# Patient Record
Sex: Female | Born: 1945 | Race: White | Hispanic: No | Marital: Married | Smoking: Never smoker
Health system: Southern US, Community
[De-identification: ages and names within clinical notes are randomized; demographics above are authoritative.]

## PROBLEM LIST (undated history)

## (undated) DIAGNOSIS — E079 Disorder of thyroid, unspecified: Secondary | ICD-10-CM

## (undated) DIAGNOSIS — F32A Depression, unspecified: Secondary | ICD-10-CM

## (undated) DIAGNOSIS — I10 Essential (primary) hypertension: Secondary | ICD-10-CM

## (undated) DIAGNOSIS — F329 Major depressive disorder, single episode, unspecified: Secondary | ICD-10-CM

---

## 2015-07-16 ENCOUNTER — Emergency Department (HOSPITAL_COMMUNITY)
Admission: EM | Admit: 2015-07-16 | Discharge: 2015-07-16 | Disposition: A | Discharge status: Home / Self Care | Payer: Medicare Other | Attending: Emergency Medicine | Admitting: Emergency Medicine

## 2015-07-16 ENCOUNTER — Encounter (HOSPITAL_COMMUNITY): Payer: Self-pay | Admitting: Emergency Medicine

## 2015-07-16 DIAGNOSIS — Z8639 Personal history of other endocrine, nutritional and metabolic disease: Secondary | ICD-10-CM | POA: Insufficient documentation

## 2015-07-16 DIAGNOSIS — F329 Major depressive disorder, single episode, unspecified: Secondary | ICD-10-CM | POA: Diagnosis not present

## 2015-07-16 DIAGNOSIS — B029 Zoster without complications: Secondary | ICD-10-CM

## 2015-07-16 DIAGNOSIS — Z79899 Other long term (current) drug therapy: Secondary | ICD-10-CM | POA: Insufficient documentation

## 2015-07-16 DIAGNOSIS — Z792 Long term (current) use of antibiotics: Secondary | ICD-10-CM | POA: Diagnosis not present

## 2015-07-16 DIAGNOSIS — I10 Essential (primary) hypertension: Secondary | ICD-10-CM | POA: Diagnosis not present

## 2015-07-16 DIAGNOSIS — H5712 Ocular pain, left eye: Secondary | ICD-10-CM | POA: Diagnosis present

## 2015-07-16 HISTORY — DX: Depression, unspecified: F32.A

## 2015-07-16 HISTORY — DX: Disorder of thyroid, unspecified: E07.9

## 2015-07-16 HISTORY — DX: Essential (primary) hypertension: I10

## 2015-07-16 HISTORY — DX: Major depressive disorder, single episode, unspecified: F32.9

## 2015-07-16 MED ORDER — PROPARACAINE HCL 0.5 % OP SOLN
1.0000 [drp] | Freq: Once | OPHTHALMIC | Status: AC
  Administered 2015-07-16: 1 [drp] via OPHTHALMIC
  Filled 2015-07-16: qty 15

## 2015-07-16 MED ORDER — OXYCODONE-ACETAMINOPHEN 5-325 MG PO TABS
1.0000 | ORAL_TABLET | Freq: Once | ORAL | Status: AC
  Administered 2015-07-16: 1 via ORAL
  Filled 2015-07-16: qty 1

## 2015-07-16 MED ORDER — OXYCODONE-ACETAMINOPHEN 5-325 MG PO TABS: 1.0000 | ORAL_TABLET | ORAL | Status: DC | PRN

## 2015-07-16 MED ORDER — ACYCLOVIR 200 MG PO CAPS
800.0000 mg | ORAL_CAPSULE | Freq: Once | ORAL | Status: AC
  Administered 2015-07-16: 800 mg via ORAL
  Filled 2015-07-16: qty 4

## 2015-07-16 MED ORDER — FLUORESCEIN SODIUM 1 MG OP STRP
ORAL_STRIP | OPHTHALMIC | Status: AC
  Administered 2015-07-16: 1 via OPHTHALMIC
  Filled 2015-07-16: qty 1

## 2015-07-16 MED ORDER — ACYCLOVIR 800 MG PO TABS: 800.0000 mg | ORAL_TABLET | Freq: Every day | ORAL | Status: DC

## 2015-07-16 MED ORDER — FLUORESCEIN SODIUM 1 MG OP STRP
1.0000 | ORAL_STRIP | Freq: Once | OPHTHALMIC | Status: AC
  Administered 2015-07-16: 1 via OPHTHALMIC

## 2015-07-16 NOTE — Discharge Instructions (Signed)

## 2015-07-16 NOTE — ED Provider Notes (Signed)
CSN: [id number]     Arrival date & time 07/16/15  1000 History   First MD Initiated Contact with Patient 07/16/15 1022     Chief Complaint  Patient presents with  . Eye Pain     (Consider location/radiation/quality/duration/timing/severity/associated sxs/prior Treatment) HPI Denise Conley is a 70 y.o. female with PMH significant for HTN, thyroid disease, and depression who presents with 1 week history of gradually worsening, constant left eye pain.  She reports she was seen at [facility] and prescribed Amoxicillin and Erythromycin ophthalmic ointment which have not provided any relief.  She has been alternating between Aleve and Ibuprofen for pain control without relief.  She describes the pain as "knife-like" and sharp.  It is not exacerbated with blinking or eye movement.  No visual disturbance, diplopia, discharge, fever, chills, HA, N/V, numbness, or weakness.  She reports she has had some left eye lid swelling and there are red areas that have come up at the corner of her eye and left sided of nose.  She reports she had her shingles vaccine these year.   Past Medical History  Diagnosis Date  . Hypertension   . Thyroid disease   . Depression    No past surgical history on file. No family history on file. Social History  Substance Use Topics  . Smoking status: Not on file  . Smokeless tobacco: Not on file  . Alcohol Use: Not on file   OB History    No data available     Review of Systems All other systems negative unless otherwise stated in HPI    Allergies  Review of patient's allergies indicates no known allergies.  Home Medications   Prior to Admission medications   Medication Sig Start Date End Date Taking? Authorizing Provider  acyclovir (ZOVIRAX) 800 MG tablet Take 1 tablet (800 mg total) by mouth 5 (five) times daily. 07/16/15   Cheri Fowler, PA-C  amoxicillin (AMOXIL) 875 MG tablet Take 875 mg by mouth 2 (two) times daily. 07/12/15  Yes Historical Provider, MD   erythromycin ophthalmic ointment Place 1 application into the left eye 2 (two) times daily. 07/12/15  Yes Historical Provider, MD  hydrochlorothiazide (MICROZIDE) 12.5 MG capsule Take 12.5 mg by mouth daily. 06/26/15  Yes Historical Provider, MD  ibuprofen (ADVIL,MOTRIN) 200 MG tablet Take 200 mg by mouth every 6 (six) hours as needed for moderate pain.   Yes Historical Provider, MD  naproxen sodium (ANAPROX) 220 MG tablet Take 220 mg by mouth 2 (two) times daily as needed (pain).   Yes Historical Provider, MD  oxyCODONE-acetaminophen (PERCOCET/ROXICET) 5-325 MG tablet Take 1 tablet by mouth every 4 (four) hours as needed for severe pain. 07/16/15   Cheri Fowler, PA-C  ramipril (ALTACE) 10 MG capsule Take 10 mg by mouth daily. 05/04/15  Yes Historical Provider, MD  sertraline (ZOLOFT) 100 MG tablet Take 100 mg by mouth daily. 05/04/15  Yes Historical Provider, MD  SYNTHROID 200 MCG tablet Take 200 mcg by mouth daily. Pt takes in addition to every other day 05/04/15  Yes Historical Provider, MD   BP 175/87 mmHg  Pulse 76  Temp(Src) 98.6 F (37 C) (Oral)  Resp 18  SpO2 97% Physical Exam  Constitutional: She is oriented to person, place, and time. She appears well-developed and well-nourished.  HENT:  Head: Normocephalic and atraumatic.  Right Ear: External ear normal.  Left Ear: External ear normal.  Eyes: Conjunctivae and EOM are normal. Pupils are equal, round, and reactive  to light. Right eye exhibits no discharge. Left eye exhibits no discharge. Right conjunctiva is not injected. Right conjunctiva has no hemorrhage. Left conjunctiva is not injected. Left conjunctiva has no hemorrhage. No scleral icterus.  Slit lamp exam:      The left eye shows no corneal abrasion, no corneal flare, no corneal ulcer, no foreign body, no hyphema, no hypopyon, no fluorescein uptake and no anterior chamber bulge.  No evidence of corneal involvement on slit lamp exam.   Neck: No tracheal deviation present.   Cardiovascular: Normal rate and regular rhythm.   Pulmonary/Chest: Effort normal and breath sounds normal. No respiratory distress.  Abdominal: She exhibits no distension.  Musculoskeletal: Normal range of motion.  Neurological: She is alert and oriented to person, place, and time.  Skin: Skin is warm and dry.  Painful vesicular lesions with erythematous base c/w shingles on left eyelid, left lateral nose, and left cheek.  Psychiatric: She has a normal mood and affect. Her behavior is normal.    ED Course  Procedures (including critical care time) Labs Review Labs Reviewed - No data to display  Imaging Review No results found. I have personally reviewed and evaluated these images and lab results as part of my medical decision-making.   EKG Interpretation None      MDM   Final diagnoses:  Shingles   Patient presents with left eye pain and vesicles on the left face consistent with shingles.  VSS, NAD.  No systemic symptoms.  Normal EOMs.  No corneal involvement on slit lamp exam.  Patient given Percocet in ED and one dose of PO Acyclovir.  Plan to discharge home with Percocet and Acyclovir.  Ophthalmology follow up.  Discussed return precautions.  Patient agrees and acknowledges the above plan for discharge.  Case has been discussed with and seen by Dr. Rubin PayorPickering who agrees with the above plan for discharge.      Cheri FowlerKayla Christepher Melchior, PA-C 07/16/15 1216  Benjiman CoreNathan Pickering, MD 07/16/15 1511

## 2015-07-16 NOTE — ED Notes (Addendum)
Cellulitis in L eye/face. Treated for stye and conjuctivitis since a week ago from urgent care ([facility]). Left side of face is swollen and patient in terrible pain. Tearful. From [place] in town babysitting [place]kids. Has had shingles vaccine and throbbing and somewhat itchy.

## 2019-02-19 ENCOUNTER — Ambulatory Visit (INDEPENDENT_AMBULATORY_CARE_PROVIDER_SITE_OTHER): Payer: Medicare Other

## 2019-02-19 ENCOUNTER — Other Ambulatory Visit: Payer: Self-pay | Admitting: Podiatry

## 2019-02-19 ENCOUNTER — Ambulatory Visit (INDEPENDENT_AMBULATORY_CARE_PROVIDER_SITE_OTHER): Payer: Medicare Other | Admitting: Podiatry

## 2019-02-19 ENCOUNTER — Other Ambulatory Visit: Payer: Self-pay

## 2019-02-19 DIAGNOSIS — M19072 Primary osteoarthritis, left ankle and foot: Secondary | ICD-10-CM | POA: Diagnosis not present

## 2019-02-19 DIAGNOSIS — M19071 Primary osteoarthritis, right ankle and foot: Secondary | ICD-10-CM | POA: Diagnosis not present

## 2019-02-19 DIAGNOSIS — M19079 Primary osteoarthritis, unspecified ankle and foot: Secondary | ICD-10-CM | POA: Diagnosis not present

## 2019-02-19 DIAGNOSIS — M778 Other enthesopathies, not elsewhere classified: Secondary | ICD-10-CM

## 2019-02-19 MED ORDER — MELOXICAM 15 MG PO TABS: 15.0000 mg | ORAL_TABLET | Freq: Every day | ORAL | 2 refills | Status: DC

## 2019-02-23 NOTE — Progress Notes (Signed)
   HPI: 73 y.o. female presenting today as a new patient with a chief complaint of dull aching pain noted to the bilateral feet that began about two years ago. Walking and being on his feet increases the pain. He has been soaking them in Epsom salt, wearing orthotics and has had injections for treatment. Patient is here for further evaluation and treatment.   Past Medical History:  Diagnosis Date  . Depression   . Hypertension   . Thyroid disease      Physical Exam: General: The patient is alert and oriented x3 in no acute distress.  Dermatology: Skin is warm, dry and supple bilateral lower extremities. Negative for open lesions or macerations.  Vascular: Palpable pedal pulses bilaterally. No edema or erythema noted. Capillary refill within normal limits.  Neurological: Epicritic and protective threshold grossly intact bilaterally.   Musculoskeletal Exam: Pain with palpation noted to the bilateral midfoot. Range of motion within normal limits to all pedal and ankle joints bilateral. Muscle strength 5/5 in all groups bilateral.   Radiographic Exam: Degenerative changes with joint space narrowing noted to the bilateral midfoot.   Assessment: 1. Bilateral midfoot capsulitis / DJD   Plan of Care:  1. Patient evaluated. X-Rays reviewed.  2. Injection of 0.5 mLs Celestone Soluspan injected into the bilateral midfoot.  3. Prescription for Meloxicam provided to patient. 4. Continue using custom orthotics.  5. Return to clinic in 6 months.   Referral from [facility].       Edrick Kins, DPM [facility]  Dr. Edrick Kins, DPM    [address], [place] [address]                Office [phone]  Fax [phone]

## 2019-05-04 ENCOUNTER — Ambulatory Visit: Payer: Medicare Other

## 2019-08-13 ENCOUNTER — Ambulatory Visit (INDEPENDENT_AMBULATORY_CARE_PROVIDER_SITE_OTHER): Payer: Medicare Other | Admitting: Podiatry

## 2019-08-13 ENCOUNTER — Other Ambulatory Visit: Payer: Self-pay

## 2019-08-13 ENCOUNTER — Encounter: Payer: Self-pay | Admitting: Podiatry

## 2019-08-13 DIAGNOSIS — M19071 Primary osteoarthritis, right ankle and foot: Secondary | ICD-10-CM

## 2019-08-13 DIAGNOSIS — M25371 Other instability, right ankle: Secondary | ICD-10-CM

## 2019-08-13 DIAGNOSIS — M19079 Primary osteoarthritis, unspecified ankle and foot: Secondary | ICD-10-CM

## 2019-08-13 DIAGNOSIS — M778 Other enthesopathies, not elsewhere classified: Secondary | ICD-10-CM

## 2019-08-13 NOTE — Progress Notes (Signed)
   HPI: 74 y.o. female presenting today for follow-up evaluation regarding right midfoot pain.  Has noticed that over the past 6 months the pain is slowly come back.  She has been soaking the foot in Epsom salt and wearing orthotics.  She also complains that over the past 6 months she has had several episodes where her right ankle gives out on her.  She denies any pain associated to the ankle however she does have some instability.  She presents for further treatment evaluation  Past Medical History:  Diagnosis Date  . Depression   . Hypertension   . Thyroid disease      Physical Exam: General: The patient is alert and oriented x3 in no acute distress.  Dermatology: Skin is warm, dry and supple bilateral lower extremities. Negative for open lesions or macerations.  Vascular: Palpable pedal pulses bilaterally. No edema or erythema noted. Capillary refill within normal limits.  Neurological: Epicritic and protective threshold grossly intact bilaterally.   Musculoskeletal Exam: Pain with palpation noted to the bilateral midfoot. Range of motion within normal limits to all pedal and ankle joints bilateral. Muscle strength 5/5 in all groups bilateral.   Radiographic Exam: Degenerative changes with joint space narrowing noted to the bilateral midfoot.   Assessment: 1. Bilateral midfoot capsulitis / DJD 2.  Ankle instability right   Plan of Care:  1. Patient evaluated. X-Rays reviewed.  2. Injection of 0.5 mLs Celestone Soluspan injected into the bilateral midfoot.  3.  Continue meloxicam as needed 4.  Compression anklet dispensed to provide support and stability to the right ankle 5.  Return to clinic as needed  Referral from [facility].       Denise Conley, DPM [facility]  Dr. Felecia Conley, DPM    [address] [place], [place] [address]                Office [phone]  Fax [phone]

## 2019-08-20 ENCOUNTER — Ambulatory Visit: Payer: Medicare Other | Admitting: Podiatry

## 2019-09-09 ENCOUNTER — Other Ambulatory Visit: Payer: Self-pay | Admitting: Orthopedic Surgery

## 2019-09-09 DIAGNOSIS — M67432 Ganglion, left wrist: Secondary | ICD-10-CM

## 2019-09-19 ENCOUNTER — Other Ambulatory Visit: Payer: Medicare Other

## 2019-09-19 ENCOUNTER — Ambulatory Visit
Admission: RE | Admit: 2019-09-19 | Discharge: 2019-09-19 | Disposition: A | Discharge status: Home / Self Care | Payer: Medicare Other | Source: Ambulatory Visit | Attending: Orthopedic Surgery | Admitting: Orthopedic Surgery

## 2019-09-19 DIAGNOSIS — M67432 Ganglion, left wrist: Secondary | ICD-10-CM

## 2019-10-06 ENCOUNTER — Emergency Department (HOSPITAL_COMMUNITY): Payer: Medicare Other

## 2019-10-06 ENCOUNTER — Other Ambulatory Visit: Payer: Self-pay

## 2019-10-06 ENCOUNTER — Emergency Department (HOSPITAL_COMMUNITY)
Admission: EM | Admit: 2019-10-06 | Discharge: 2019-10-06 | Disposition: A | Discharge status: Home / Self Care | Payer: Medicare Other | Attending: Emergency Medicine | Admitting: Emergency Medicine

## 2019-10-06 ENCOUNTER — Encounter (HOSPITAL_COMMUNITY): Payer: Self-pay | Admitting: Pharmacy Technician

## 2019-10-06 DIAGNOSIS — Z79899 Other long term (current) drug therapy: Secondary | ICD-10-CM | POA: Insufficient documentation

## 2019-10-06 DIAGNOSIS — M21962 Unspecified acquired deformity of left lower leg: Secondary | ICD-10-CM | POA: Insufficient documentation

## 2019-10-06 DIAGNOSIS — S8992XA Unspecified injury of left lower leg, initial encounter: Secondary | ICD-10-CM | POA: Diagnosis present

## 2019-10-06 DIAGNOSIS — W010XXA Fall on same level from slipping, tripping and stumbling without subsequent striking against object, initial encounter: Secondary | ICD-10-CM | POA: Insufficient documentation

## 2019-10-06 DIAGNOSIS — I10 Essential (primary) hypertension: Secondary | ICD-10-CM | POA: Diagnosis not present

## 2019-10-06 DIAGNOSIS — Y9389 Activity, other specified: Secondary | ICD-10-CM | POA: Diagnosis not present

## 2019-10-06 DIAGNOSIS — Y92002 Bathroom of unspecified non-institutional (private) residence single-family (private) house as the place of occurrence of the external cause: Secondary | ICD-10-CM | POA: Diagnosis not present

## 2019-10-06 DIAGNOSIS — S82832A Other fracture of upper and lower end of left fibula, initial encounter for closed fracture: Secondary | ICD-10-CM | POA: Diagnosis not present

## 2019-10-06 DIAGNOSIS — Y999 Unspecified external cause status: Secondary | ICD-10-CM | POA: Diagnosis not present

## 2019-10-06 DIAGNOSIS — Q899 Congenital malformation, unspecified: Secondary | ICD-10-CM

## 2019-10-06 LAB — BASIC METABOLIC PANEL
Anion gap: 9 (ref 5–15)
BUN: 22 mg/dL (ref 8–23)
CO2: 27 mmol/L (ref 22–32)
Calcium: 9.1 mg/dL (ref 8.9–10.3)
Chloride: 105 mmol/L (ref 98–111)
Creatinine, Ser: 1.2 mg/dL — ABNORMAL HIGH (ref 0.44–1.00)
GFR calc Af Amer: 52 mL/min — ABNORMAL LOW (ref >60)
GFR calc non Af Amer: 45 mL/min — ABNORMAL LOW (ref >60)
Glucose, Bld: 153 mg/dL — ABNORMAL HIGH (ref 70–99)
Potassium: 4.4 mmol/L (ref 3.5–5.1)
Sodium: 141 mmol/L (ref 135–145)

## 2019-10-06 LAB — CBC
HCT: 43.2 % (ref 36.0–46.0)
Hemoglobin: 13.9 g/dL (ref 12.0–15.0)
MCH: 28.6 pg (ref 26.0–34.0)
MCHC: 32.2 g/dL (ref 30.0–36.0)
MCV: 88.9 fL (ref 80.0–100.0)
Platelets: 148 10*3/uL — ABNORMAL LOW (ref 150–400)
RBC: 4.86 MIL/uL (ref 3.87–5.11)
RDW: 12.7 % (ref 11.5–15.5)
WBC: 5.9 10*3/uL (ref 4.0–10.5)
nRBC: 0 % (ref 0.0–0.2)

## 2019-10-06 MED ORDER — MORPHINE SULFATE (PF) 4 MG/ML IV SOLN
4.0000 mg | Freq: Once | INTRAVENOUS | Status: AC
  Administered 2019-10-06: 4 mg via INTRAVENOUS
  Filled 2019-10-06: qty 1

## 2019-10-06 MED ORDER — SODIUM CHLORIDE 0.9 % IV BOLUS
1000.0000 mL | Freq: Once | INTRAVENOUS | Status: AC
  Administered 2019-10-06: 1000 mL via INTRAVENOUS

## 2019-10-06 NOTE — ED Triage Notes (Signed)
Pt bib ems from home with LLE deformity after mechanical fall. Splint to LLE placed by ems. Pt initially with no pedal pulses, after reduction and splint placement by ems pt with +1 pedal pulses. Sensory and motion in tact. Initial bp 80 systolic with HR of 50. Given 300cc NS with improvement in BP to 138/70. Pt given fentanyl en route with no improvement in pain. Pt is not on blood thinners, did not hit head, no LOC.

## 2019-10-06 NOTE — ED Notes (Signed)
Pt feeling much better, denies any dizziness or nausea.

## 2019-10-06 NOTE — Progress Notes (Signed)
Orthopedic Tech Progress Note Patient Details:  Denise Conley [birth year] [id number]  Ortho Devices Type of Ortho Device: Post (short leg) splint Ortho Device/Splint Location: LLE Ortho Device/Splint Interventions: Ordered, Application   Post Interventions Patient Tolerated: Well Instructions Provided: Care of device   Donald Pore 10/06/2019, 2:07 PM

## 2019-10-06 NOTE — ED Notes (Signed)
Upon d.c while pt was waiting in wheelchair outside for car to pull around, pt suddenly felt hot, nauseated and had a near syncopal episode. ED tech brought pt back to room and EDP at bedside. Pt alert, states she thinks she is just dehydrated

## 2019-10-06 NOTE — ED Notes (Signed)
Ortho tech at bedside 

## 2019-10-06 NOTE — ED Provider Notes (Addendum)
[facility] EMERGENCY DEPARTMENT Provider Note   CSN: [id number] Arrival date & time: 10/06/19  1129     History Chief Complaint  Patient presents with  . Leg Injury    Denise Conley is a 74 y.o. female.  The history is provided by the patient.  Ankle Pain Location:  Ankle Time since incident:  1 hour Injury: yes   Mechanism of injury: fall   Fall:    Fall occurred:  Tripped   Height of fall:  5 ft   Impact surface:  Hard floor Ankle location:  L ankle Pain details:    Quality:  Aching   Radiates to:  Does not radiate   Severity:  Moderate   Onset quality:  Sudden   Duration:  1 hour   Timing:  Constant Chronicity:  New Foreign body present:  No foreign bodies Tetanus status:  Unknown Prior injury to area:  No Relieved by:  Nothing Worsened by:  Nothing Ineffective treatments:  None tried Associated symptoms: no back pain and no fever    74 year old female with no relevant past medical history presents with left ankle injury after tripping in the shower.  Patient states that she was cleaning her tub when she tried to step out of the tub and tripped on her left ankle.  Patient endorsed immediate pain to the left ankle.  Upon EMS arrival patient's blood pressure was noted to be in the 80s however this improved after 1 L of fluids to 140s over 60s.  Patient was noted to have a weak pulse at that time however after manipulation of the ankle pulse improved.  Denies LOC, hitting her head, neck pain, chest pain, abdominal pain.    Past Medical History:  Diagnosis Date  . Depression   . Hypertension   . Thyroid disease     There are no problems to display for this patient.    The histories are not reviewed yet. Please review them in the "History" navigator section and refresh this SmartLink.   OB History   No obstetric history on file.     No family history on file.  Social History   Tobacco Use  . Smoking status: Never Smoker  . Smokeless  tobacco: Never Used  Substance Use Topics  . Alcohol use: Not on file  . Drug use: Not on file    Home Medications Prior to Admission medications   Medication Sig Start Date End Date Taking? Authorizing Provider  calcium carbonate (TUMS - DOSED IN MG ELEMENTAL CALCIUM) 500 MG chewable tablet Chew 3-4 tablets by mouth 3 (three) times daily as needed for indigestion or heartburn.   Yes [provider]  fluocinonide cream (LIDEX) 0.05 % Apply 1 application topically 4 (four) times daily as needed (irritation vaginally).  12/24/18  Yes [provider]  fluticasone (FLONASE) 50 MCG/ACT nasal spray Place 1-2 sprays into both nostrils daily as needed for allergies or rhinitis.   Yes [provider]  ibuprofen (ADVIL,MOTRIN) 200 MG tablet Take 400 mg by mouth every 6 (six) hours as needed for moderate pain.    Yes [provider]  ramipril (ALTACE) 10 MG capsule Take 10 mg by mouth daily. 05/04/15  Yes [provider]  sertraline (ZOLOFT) 100 MG tablet Take 100 mg by mouth daily. 05/04/15  Yes [provider]  SYNTHROID 200 MCG tablet Take 200 mcg by mouth daily. Pt takes in addition to every other day 05/04/15  Yes [provider]  SYNTHROID 25 MCG tablet Take 25 mcg by mouth once a week.  02/06/19  Yes [provider]  VITAMIN D PO Take 1 capsule by mouth daily.   Yes [provider]  acyclovir (ZOVIRAX) 800 MG tablet Take 1 tablet (800 mg total) by mouth 5 (five) times daily. Patient not taking: Reported on 10/06/2019 07/16/15   Cheri Fowler, PA-C  meloxicam (MOBIC) 15 MG tablet Take 1 tablet (15 mg total) by mouth daily. Patient not taking: Reported on 10/06/2019 02/19/19   Felecia Shelling, DPM  oxyCODONE-acetaminophen (PERCOCET/ROXICET) 5-325 MG tablet Take 1 tablet by mouth every 4 (four) hours as needed for severe pain. Patient not taking: Reported on 10/06/2019 07/16/15   Cheri Fowler, PA-C    Allergies    Shellfish  allergy, Sulfa antibiotics, and Amoxicillin-pot clavulanate  Review of Systems   Review of Systems  Constitutional: Negative for chills and fever.  HENT: Negative for ear pain and sore throat.   Eyes: Negative for pain and visual disturbance.  Respiratory: Negative for cough and shortness of breath.   Cardiovascular: Negative for chest pain and palpitations.  Gastrointestinal: Negative for abdominal pain and vomiting.  Genitourinary: Negative for dysuria and hematuria.  Musculoskeletal: Negative for arthralgias and back pain.       L ankle pain  Skin: Negative for color change and rash.  Neurological: Negative for seizures and syncope.  All other systems reviewed and are negative.   Physical Exam Updated Vital Signs BP 126/66   Pulse (!) 53   Temp 98 F (36.7 C) (Oral)   Resp 18   Ht 5\' 6"  (1.676 m)   Wt 83.5 kg   SpO2 100%   BMI 29.70 kg/m   Physical Exam Vitals and nursing note reviewed.  Constitutional:      General: She is not in acute distress.    Appearance: She is well-developed.  HENT:     Head: Normocephalic and atraumatic.  Eyes:     Conjunctiva/sclera: Conjunctivae normal.  Cardiovascular:     Rate and Rhythm: Normal rate and regular rhythm.     Heart sounds: No murmur heard.   Pulmonary:     Effort: Pulmonary effort is normal. No respiratory distress.     Breath sounds: Normal breath sounds.  Abdominal:     Palpations: Abdomen is soft.     Tenderness: There is no abdominal tenderness.  Musculoskeletal:        General: Swelling, tenderness and signs of injury present.     Cervical back: Neck supple.     Comments: Tenderness and swelling to lateral aspect of pt's L ankle.  2+ DP pulses bl.  Neurovascularly intact.  Skin:    General: Skin is warm and dry.  Neurological:     Mental Status: She is alert.     ED Results / Procedures / Treatments   Labs (all labs ordered are listed, but only abnormal results are displayed) Labs Reviewed  CBC -  Abnormal; Notable for the following components:      Result Value   Platelets 148 (*)    All other components within normal limits  BASIC METABOLIC PANEL - Abnormal; Notable for the following components:   Glucose, Bld 153 (*)    Creatinine, Ser 1.20 (*)    GFR calc non Af Amer 45 (*)    GFR calc Af Amer 52 (*)    All other components within normal limits    EKG EKG Interpretation  Date/Time:  Monday October 06 2019 15:19:10 EDT Ventricular Rate:  58 PR Interval:    QRS Duration: 95 QT Interval:  450 QTC Calculation: 442 R Axis:   63 Text Interpretation: Sinus rhythm Low voltage, precordial leads Nonspecific ST abnormality No previous tracing Confirmed by Cathren LaineSteinl, Kevin ([id number]) on 10/06/2019 3:32:46 PM   Radiology DG Tibia/Fibula Left Port  Result Date: 10/06/2019 CLINICAL DATA:  Fall EXAM: PORTABLE LEFT TIBIA AND FIBULA - 2 VIEW COMPARISON:  February 19, 2019 FINDINGS: There is a minimally displaced fracture of the posterior malleolus. There is a mildly displaced fracture of the distal fibula with 1/4 shaft with displacement of the distal fragment. There is widening of the medial malleolus to 8 mm on limited view. Soft tissue edema. IMPRESSION: Fractures of the posterior malleolus and distal fibula. Widening of the medial malleolus to 8 mm on limited view suggestive of ligamentous injury. Recommend dedicated ankle radiographs for additional evaluation. Electronically Signed   By: Meda KlinefelterStephanie  Peacock MD   On: 10/06/2019 12:10   DG Foot Complete Left  Result Date: 10/06/2019 CLINICAL DATA:  Fall EXAM: LEFT FOOT - COMPLETE 3+ VIEW COMPARISON:  Left tib fib 10/06/2019 FINDINGS: Negative for foot fracture. Mild degenerative change in the first MTP. Ankle fracture as described previously. IMPRESSION: Negative for foot fracture.  Ankle fracture as described separately. Electronically Signed   By: Marlan Palauharles  Clark M.D.   On: 10/06/2019 12:56    Procedures Procedures (including critical care  time)  Medications Ordered in ED Medications  morphine 4 MG/ML injection 4 mg (4 mg Intravenous Given 10/06/19 1338)  sodium chloride 0.9 % bolus 1,000 mL (1,000 mLs Intravenous New Bag/Given 10/06/19 1524)    ED Course  I have reviewed the triage vital signs and the nursing notes.  Pertinent labs & imaging results that were available during my care of the patient were reviewed by me and considered in my medical decision making (see chart for details).    MDM Rules/Calculators/A&P                          74 yo F presents with left ankle pain after a fall in the toe.  Afebrile vital signs stable.  Exam as above.  Exam most notable for swelling to left lateral malleolus.  No fibular head tenderness.  No other evidence of trauma on physical exam.  X-ray of the left ankle notable for left posterior malleolar fracture, left distal fibular fracture as well as widening of the medial malleolus suggestive of possible fracture.  Given concern for possible trimalleolar fracture.  Patient was given morphine for pain with adequate pain control.  Orthopedics was consulted who recommended patient be placed in a posterior splint with stirrups and follow-up in clinic in 2 days.  This was communicated to the patient who voiced agreement and understanding of this plan.  Upon reevaluation patient denies any complaints.  Do not feel the patient requires further labs or imaging at this time.  Patient was given ED return precaution and the patient was discharged in stable condition without further events.   Addendum: Sure that the patient's discharge I was notified by nursing staff the patient had an episode of emesis as well as presyncope while being transferred into her car in the parking lot.  Patient was reevaluated upon representation to the emerge department was found to be pale and slightly diaphoretic.  Patient states that she suddenly felt weak and nauseous while being transferred into the  car however her  symptoms have since resolved.  Denies chest pain, shortness of breath.  Vital signs within normal limits.  Pulse 59.  Will give patient fluids as well as get basic labs and reassess.  EKG showed normal sinus rhythm without signs of ischemia or arrhythmia.  BMP, CBC unremarkable.  Upon reevaluation patient stated that her symptoms had resolved and no longer felt weak.  Patient symptoms likely vasovagal in nature.  Do not feel the patient's symptoms are consistent with cardiac, neurologic or pulmonary etiology.  Patient states that she has no further complaints.  Patient was sat up by nursing staff and was tolerating p.o. intake at time of discharge.  Patient and her husband denied any other questions or complaints.  Patient was discharged in stable condition without further events.  Final Clinical Impression(s) / ED Diagnoses Final diagnoses:  Deformity  Other closed fracture of distal end of left fibula, initial encounter    Rx / DC Orders ED Discharge Orders    None       Rickey Primus, MD 10/06/19 1504    Rickey Primus, MD 10/06/19 1638    Cathren Laine, MD 10/09/19 1052

## 2019-10-06 NOTE — ED Notes (Signed)
Pt given apple sauce and crackers, sitting on the side of the bed

## 2019-10-06 NOTE — ED Notes (Signed)
Main lab to add on labs 

## 2019-10-08 ENCOUNTER — Other Ambulatory Visit: Payer: Self-pay

## 2019-10-08 ENCOUNTER — Encounter: Payer: Self-pay | Admitting: Podiatry

## 2019-10-08 ENCOUNTER — Ambulatory Visit
Admission: RE | Admit: 2019-10-08 | Discharge: 2019-10-08 | Disposition: A | Discharge status: Home / Self Care | Payer: Medicare Other | Source: Ambulatory Visit | Attending: Podiatry | Admitting: Podiatry

## 2019-10-08 ENCOUNTER — Telehealth: Payer: Self-pay | Admitting: *Deleted

## 2019-10-08 ENCOUNTER — Ambulatory Visit (INDEPENDENT_AMBULATORY_CARE_PROVIDER_SITE_OTHER): Payer: Medicare Other | Admitting: Podiatry

## 2019-10-08 DIAGNOSIS — S82899A Other fracture of unspecified lower leg, initial encounter for closed fracture: Secondary | ICD-10-CM

## 2019-10-08 DIAGNOSIS — S82852A Displaced trimalleolar fracture of left lower leg, initial encounter for closed fracture: Secondary | ICD-10-CM | POA: Diagnosis not present

## 2019-10-08 DIAGNOSIS — M19079 Primary osteoarthritis, unspecified ankle and foot: Secondary | ICD-10-CM

## 2019-10-08 DIAGNOSIS — S82892A Other fracture of left lower leg, initial encounter for closed fracture: Secondary | ICD-10-CM

## 2019-10-08 DIAGNOSIS — R600 Localized edema: Secondary | ICD-10-CM

## 2019-10-08 DIAGNOSIS — M25572 Pain in left ankle and joints of left foot: Secondary | ICD-10-CM

## 2019-10-08 DIAGNOSIS — M778 Other enthesopathies, not elsewhere classified: Secondary | ICD-10-CM

## 2019-10-08 DIAGNOSIS — M25371 Other instability, right ankle: Secondary | ICD-10-CM

## 2019-10-08 LAB — CBG MONITORING, ED: Glucose-Capillary: 135 mg/dL — ABNORMAL HIGH (ref 70–99)

## 2019-10-08 MED ORDER — TRAMADOL HCL 50 MG PO TABS: 50.0000 mg | ORAL_TABLET | Freq: Four times a day (QID) | ORAL | 0 refills | Status: AC | PRN

## 2019-10-08 MED ORDER — ACETAMINOPHEN 500 MG PO TABS
1000.0000 mg | ORAL_TABLET | Freq: Four times a day (QID) | ORAL | 0 refills | Status: AC | PRN
Start: 2019-10-08 — End: 2019-11-07

## 2019-10-08 MED ORDER — IBUPROFEN 600 MG PO TABS
600.0000 mg | ORAL_TABLET | Freq: Three times a day (TID) | ORAL | 0 refills | Status: AC | PRN
Start: 2019-10-08 — End: 2019-11-07

## 2019-10-08 NOTE — Telephone Encounter (Signed)
Faxed orders to [facility] as STAT with results prior to 10/10/2019.

## 2019-10-08 NOTE — Telephone Encounter (Signed)
I called [employer], message states [employer] supplement for Medicare does not required pre-cert.

## 2019-10-08 NOTE — Patient Instructions (Addendum)
Pain plan:   Take 1,000mg  tylenol every 6 hours  Alternate this with 600mg  ibuprofen every 6 hours (so take tylenol, then 3 hours later, take ibuprofen)  Then, for any breakthrough pain, take tramadol 50mg    Ankle Fracture  The ankle joint is made up of the lower (distal) sections of your lower leg bones(tibia and fibula) along with a bone in your foot (talus). An ankle fracture is a break in one, two, or all three of these sections of bone. Follow these instructions at home: If you have a splint:  Wear the splint as told by your doctor. Take it off only as told by your doctor.  Loosen the splint if your toes tingle, become numb, or turn cold and blue.  Keep the splint clean.  If the splint is not waterproof: ? Do not let it get wet. ? Cover it with a watertight covering when you take a bath or a shower. If you have a cast:  Do not stick anything inside the cast to scratch your skin. Doing that increases your risk of infection.  Check the skin around the cast every day. Tell your doctor about any concerns.  You may put lotion on dry skin around the edges of the cast. Do not put lotion on the skin underneath the cast.  Keep the cast clean.  If the cast is not waterproof: ? Do not let it get wet. ? Cover it with a watertight covering when you take a bath or a shower. Managing pain, stiffness, and swelling  If directed, put ice on the injured area: ? If you have a removable splint, remove it as told by your doctor. ? Put ice in a plastic bag. ? Place a towel between your skin and the bag. ? Leave the ice on for 20 minutes, 2-3 times a day.  Move your toes often. This prevents stiffness and lessens swelling.  Raise (elevate) the injured area above the level of your heart while you are sitting or lying down. General instructions  Do not use the injured limb to support your body weight until your doctor says that you can. Use crutches as told by your doctor.  Take  over-the-counter and prescription medicines only as told by your doctor.  Ask your doctor when it is safe to drive if you have a cast or splint.  Do exercises as told by your doctor.  Do not use any products that contain nicotine or tobacco, such as cigarettes and e-cigarettes. These can delay bone healing. If you need help quitting, ask your doctor.  Keep all follow-up visits as told by your doctor. This is important. Contact a doctor if:  Your pain or swelling gets worse.  Your pain or swelling does not get better when you rest or take medicine. Get help right away if:  Your cast gets damaged.  You continue to have very bad pain.  You have new pain or swelling.  Your skin or toes below the injured ankle: ? Turn blue or gray. ? Feel cold or numb. ? Lose sensitivity to touch. Summary  An ankle fracture is a break in one, two, or all three of the bones in your lower leg and lower foot.  If you have a splint, wear it as told by your health care provider. Keep it clean and dry.  If you have a cast, do not stick anything inside the cast to scratch your skin. This can cause infection.  Use ice, take medicines, raise  your foot, and avoid tobacco and nicotine products. These steps will lessen pain and swelling and speed up healing. This information is not intended to replace advice given to you by your health care provider. Make sure you discuss any questions you have with your health care provider. Document Revised: 02/02/2017 Document Reviewed: 03/27/2016 Elsevier Patient Education  2020 [employer].

## 2019-10-08 NOTE — Telephone Encounter (Signed)
-----   Message from Edwin Cap, DPM sent at 10/08/2019  7:20 AM EDT ----- Regarding: CT scan Im seeing this patient this afternoon, she has a fractured left ankle, planning to fix at the surgery center. What are the odds Id be able to get a CT scan by Friday AM?

## 2019-10-08 NOTE — Progress Notes (Signed)
Subjective:  Patient ID: Denise Conley, female    DOB: [birth year],  MRN: [id number]  Chief Complaint  Patient presents with  . Fracture    L ankle - fracture. 8/10 pain. Pt stated, "I fell in the shower 2 days ago. Wearing splint/cast. I bought a knee scooter - they didn't give me anything at the hospital. They didn't give me pain medication, but it's constantly throbbing".     74 y.o. female presents with the above complaint. History confirmed with patient.  After her fall in the shower, she had to call 911 to be taken to the [facility] emergency room.  She is evaluated there, x-rays revealed a left ankle fracture.  She says she was placed in a splint and wrap was put over top of this, and they told her to follow-up with a foot and ankle doctor.  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  The ankle has been severely painful, it throbs and is swollen and is very bruised.  She was not given crutches at the ER, she went and bought crutches and a rolling knee scooter.  She lives in a house with her husband, which is primarily a single-story dwelling with 3 steps to enter.  She states she is doing "okay" with nonweightbearing so far.  She was able to have her CT scan completed today prior to the appointment  Objective:  Physical Exam: Unable to palpate her DP and PT pulse secondary to severe brawny edema.  She does have a palpable popliteal pulse.  Skin is warm and well-perfused, the dorsal foot and medial lateral ankles are ecchymotic.  No skin lines are noted today.  No necrosis or disruption of skin.  No fracture blisters noted today.  Sharp tenderness to the lateral and medial ankle.  Imaging studies: Narrative & Impression CLINICAL DATA:  Left ankle fracture status post fall x 2 days ago. Pain, swelling, bruising   EXAM: CT OF THE LEFT ANKLE WITHOUT CONTRAST 10/08/19:   TECHNIQUE: Multidetector CT imaging of the left ankle was performed according to the  standard protocol. Multiplanar CT image reconstructions were also generated.   COMPARISON:  None.   FINDINGS: Bones/Joint/Cartilage   Comminuted oblique fracture of the distal fibular metaphysis with 2 mm posterior displacement.   Comminuted nondisplaced fracture of the posterior malleolus of the distal tibia. 2 punctate calcification along the deltoid ligament likely reflecting avulsive injury.   No other acute fracture or dislocation. Ankle mortise is intact. No joint effusion.   Ligaments   Ligaments are suboptimally evaluated by CT.   Muscles and Tendons Muscles are normal. Flexor, extensor, peroneal and Achilles tendons are grossly intact. Posterior tibial tendon and flexor digitorum tendon coursing medially adjacent to the posterior malleolar fracture.   Soft tissue No fluid collection or hematoma. No soft tissue mass. Soft tissue edema circumferentially around the ankle.   IMPRESSION: 1. Comminuted oblique fracture of the distal fibular metaphysis with 2 mm posterior displacement. 2. Comminuted nondisplaced fracture of the posterior malleolus of the distal tibia. Avulsive injury along the deltoid ligament. 3. Posterior tibial tendon and flexor digitorum tendon coursing medially adjacent to the posterior malleolar fracture without entrapment. Narrative & Impression CLINICAL DATA:  Fall   EXAM: PLAIN FILM RADIOGRAPHS 10/06/19   COMPARISON:  February 19, 2019   FINDINGS: There is a minimally displaced fracture of the posterior malleolus. There is a mildly displaced fracture of the distal fibula with 1/4 shaft with displacement of  the distal fragment. There is widening of the medial malleolus to 8 mm on limited view. Soft tissue edema.   IMPRESSION: Fractures of the posterior malleolus and distal fibula. Widening of the medial malleolus to 8 mm on limited view suggestive of ligamentous injury.    Assessment:   1. Closed trimalleolar fracture of left  ankle, initial encounter   2. Edema of left lower extremity   3. Acute left ankle pain      Plan:  Patient was evaluated and treated and all questions answered.  -I had a long discussion today with Denise Conley regarding her left ankle injury, and discussed the mechanism of the fall and injury in detail with her and how this led to a trimalleolar equivalent left ankle fracture.  I reviewed the radiographs and CT scan in detail with her and we discussed that she has a comminuted distal fibular fracture, a posterior malleolar fracture as well as likely avulsion/disruption of the deltoid ligament complex. -I discussed the nature of ankle fractures, as well as the expected prognosis and associated morbidity including posttraumatic ankle arthritis. -Discussed with her that she would benefit from operative treatment of the fracture, and that the goal of ORIF is to restore anatomic alignment and skeletal stability in order to allow for early mobility of the extremity.  I also discussed with her that the goal of reduction of the fracture into anatomic alignment is to reduce the likelihood and severity of posttraumatic ankle arthritis, but the some degree of this is likely to happen at some point in her lifetime. -While reviewing her radiographs I discussed the procedure in detail with her, including fixation of the fibular fracture, stress exam under fluoroscopy as well as possible repair of the deltoid ligament complex and or the posterior malleolar fracture if indicated. -We discussed the risk, benefits and potential complications of the procedure as well as alternatives to surgery including nonoperative treatment.  I discussed with her that she could be at risk for delayed healing given her bone density, and that she should continue her supplementation of vitamin D and calcium.  We also reviewed the expected postoperative course and timing and rationale of early nonweightbearing, transition to weightbearing,  and range of motion and physical therapy as she progresses. -Informed consent was reviewed and signed with her today. -Initially I had planned to proceed to the operating room with her on Friday, 10/10/2019, however due to the lack of compression dressing from the emergency department I believe it is in her best interest to delay the surgery 1 week to 10/17/2019.  A 3 layer Jones compression dressing was applied to the left lower extremity as well as a well-padded posterior splint and stirrup splint.  Reviewed importance of keeping the leg elevated, putting absolutely no weight on the leg, and ice behind the knee to reduce edema.    Surgical plan:  Procedure: -#1 ORIF of left trimalleolar ankle fracture #2 possible syndesmotic repair #3 possible repair of deltoid ligament #4 stress exam under anesthesia and fluoroscopy  Location: -[facility] 10/17/2019  Anesthesia plan: -General anesthesia with popliteal and saphenous block  Postoperative pain plan: -Acetaminophen 1000 mg every 6 hours, ibuprofen 600 mg every 6 hours, gabapentin 300 mg every 8 hours x5 days, oxycodone 5 mg 1 to 2 tablets every 4-6 hours as needed severe pain  -Today, she does not have any analgesics that she was sent from the ER, I have prescribed her the acetaminophen and ibuprofen early.  She indicated  that she does not yet want narcotic pain medication as strong as oxycodone, I recommended a small prescription for tramadol to use only as needed.  DVT prophylaxis: -Xarelto 10 mg nightly to begin postop day 1     Follow-up after surgery  Sharl Ma, DPM 10/08/2019

## 2019-10-10 ENCOUNTER — Ambulatory Visit (INDEPENDENT_AMBULATORY_CARE_PROVIDER_SITE_OTHER): Payer: Medicare Other | Admitting: Podiatry

## 2019-10-10 ENCOUNTER — Other Ambulatory Visit: Payer: Self-pay

## 2019-10-10 DIAGNOSIS — M25572 Pain in left ankle and joints of left foot: Secondary | ICD-10-CM

## 2019-10-11 NOTE — Progress Notes (Signed)
  Subjective:  Patient ID: Denise Conley, female    DOB: Nov 29, 1945,  MRN: [id number]  Chief Complaint  Patient presents with  . Follow-up    L ankle fracture. Pt stated, "I can't tolerate the splint anymore - the sharp plastic hurts my ankle and digs into my leg. I take Tylenol and ibuprofen consistently, and I also take Tramadol when I need it".    74 y.o. female presents with the above complaint. History confirmed with patient.   Objective:  Physical Exam: LLE in posterior splint with saddle splint, sensation and capillary refil to digits intact, pain on lateral ankle  Assessment:   1. Acute left ankle pain      Plan:  Patient was evaluated and treated and all questions answered.  -The previous posterior splint and subtle splint was removed.  A new molded more comfortable well-padded plaster splint was applied with a posterior slab and sugar tong splint. -Follow-up after surgery

## 2019-10-16 ENCOUNTER — Telehealth: Payer: Self-pay | Admitting: *Deleted

## 2019-10-16 NOTE — Telephone Encounter (Signed)
Pt called states she is having surgery tomorrow with Dr. Lilian Kapur and she wanted to know when she should stop taking the tylenol and Ibuprofen and tramadol.

## 2019-10-16 NOTE — Telephone Encounter (Signed)
I spoke with pt and asked when she took her last ibuprofen and she stated today at 12:00pm, last tylenol yesterday 9:40pm, and has not had a tramadol today or yesterday. I told pt to stop the ibuprofen and tramadol, may continue the tylenol, I would inform Dr. Lilian Kapur.

## 2019-10-17 ENCOUNTER — Other Ambulatory Visit: Payer: Self-pay | Admitting: Podiatry

## 2019-10-17 DIAGNOSIS — S93422D Sprain of deltoid ligament of left ankle, subsequent encounter: Secondary | ICD-10-CM | POA: Diagnosis not present

## 2019-10-17 DIAGNOSIS — S82852A Displaced trimalleolar fracture of left lower leg, initial encounter for closed fracture: Secondary | ICD-10-CM | POA: Diagnosis not present

## 2019-10-17 MED ORDER — GABAPENTIN 300 MG PO CAPS
300.0000 mg | ORAL_CAPSULE | Freq: Three times a day (TID) | ORAL | 0 refills | Status: AC
Start: 2019-10-17 — End: 2019-10-24

## 2019-10-17 MED ORDER — RIVAROXABAN 10 MG PO TABS
10.0000 mg | ORAL_TABLET | Freq: Every day | ORAL | 0 refills | Status: DC
Start: 2019-10-18 — End: 2023-03-30

## 2019-10-17 MED ORDER — OXYCODONE HCL 5 MG PO TABS: 5.0000 mg | ORAL_TABLET | ORAL | 0 refills | Status: DC | PRN

## 2019-10-17 NOTE — Progress Notes (Signed)
[facility] 10/17/19 L Ankle ORIF

## 2019-10-20 ENCOUNTER — Telehealth: Payer: Self-pay | Admitting: Podiatry

## 2019-10-20 MED ORDER — OXYCODONE HCL 5 MG PO TABS: 10.0000 mg | ORAL_TABLET | ORAL | 0 refills | Status: DC | PRN

## 2019-10-20 NOTE — Telephone Encounter (Signed)
Talked to Ms Burling this AM, she is having significant pain still and I advised her to take 10mg  oxycodone every 4-6 hours PRN. Refill sent for this for the remainder of the week.  , DPM 10/20/2019

## 2019-10-20 NOTE — Telephone Encounter (Signed)
Pt called stating she did not know if she could get something stronger for pain states it is a 10 at times only depends on what position the foot is in

## 2019-10-20 NOTE — Addendum Note (Signed)
Addended byLilian Kapur, Yazmyne Sara R on: 10/20/2019 09:38 AM   Modules accepted: Orders

## 2019-10-24 ENCOUNTER — Other Ambulatory Visit: Payer: Self-pay

## 2019-10-24 ENCOUNTER — Ambulatory Visit (INDEPENDENT_AMBULATORY_CARE_PROVIDER_SITE_OTHER): Payer: Medicare Other

## 2019-10-24 ENCOUNTER — Ambulatory Visit (INDEPENDENT_AMBULATORY_CARE_PROVIDER_SITE_OTHER): Payer: Medicare Other | Admitting: Podiatry

## 2019-10-24 ENCOUNTER — Encounter: Payer: Self-pay | Admitting: Podiatry

## 2019-10-24 DIAGNOSIS — S82892A Other fracture of left lower leg, initial encounter for closed fracture: Secondary | ICD-10-CM | POA: Diagnosis not present

## 2019-10-24 DIAGNOSIS — S82892D Other fracture of left lower leg, subsequent encounter for closed fracture with routine healing: Secondary | ICD-10-CM

## 2019-10-24 DIAGNOSIS — Z9889 Other specified postprocedural states: Secondary | ICD-10-CM

## 2019-10-24 MED ORDER — LIDOCAINE 5 % EX OINT: 1.0000 "application " | TOPICAL_OINTMENT | CUTANEOUS | 0 refills | Status: DC | PRN

## 2019-10-24 MED ORDER — TRAMADOL HCL 50 MG PO TABS: 50.0000 mg | ORAL_TABLET | ORAL | 0 refills | Status: AC | PRN

## 2019-10-25 NOTE — Progress Notes (Signed)
  Subjective:  Patient ID: Denise Conley, female    DOB: 1945/09/11,  MRN: [id number]  Chief Complaint  Patient presents with  . Routine Post Op    pov#1 dos 08.13.2021 ORIF Fx Ankle Lt/Poss. Ligament Repair Lt,"left foot is still sore"    DOS: 10/17/2019 Procedure: ORIF trimalleolar ankle fracture left with plate and screw fixation of the fibula and deltoid repair  74 y.o. female returns for post-op check.  She states she feels well, her pain is slowly improving.  It was pretty severe over the weekend.  No issues with nausea or vomiting.  She would like to discontinue the Percocet as soon as possible and return back to tramadol she says.  Presents in postoperative splint from operating room with dressings clean dry and intact, no evidence of weightbearing on dressings or splint  Review of Systems: Negative except as noted in the HPI. Denies N/V/F/Ch.   Objective:  There were no vitals filed for this visit. There is no height or weight on file to calculate BMI. Constitutional Well developed. Well nourished.  Vascular Foot warm and well perfused. Capillary refill normal to all digits.   Neurologic Normal speech. Oriented to person, place, and time. Epicritic sensation to light touch grossly present bilaterally.  Dermatologic Skin healing well without signs of infection. Skin staples in place with edges well coapted without signs of infection.  Orthopedic: Tenderness to palpation noted about the surgical site.   Radiographs: Left ankle hardware is intact and in place and consistent with postoperative position and changes.  Butterfly fragment on posterior lateral noted and in similar postoperative position Assessment:   1. Closed fracture of left ankle with routine healing, subsequent encounter   2. Post-operative state    Plan:  Patient was evaluated and treated and all questions answered.  S/p left ankle ORIF and deltoid ligament repair -Progressing as expected  post-operatively. -XR: Stable postoperative changes noted -WB Status: NWB in posterior splint -Staples to remain intact until next visit in 2 weeks for removal -Medications: Send a refill of her tramadol, she will cease using the oxycodone.  Also sent her prescription for lidocaine 5% ointment, she will bring this to her next visit to apply the staples for removal -Left ankle was redressed with Xeroform, dry sterile gauze 4 x 4's, Kerlix, and a well-padded posterior below-knee splint  Return in about 2 weeks (around 11/07/2019) for to remove staples.

## 2019-10-31 ENCOUNTER — Encounter: Payer: Medicare Other | Admitting: Podiatry

## 2019-11-06 ENCOUNTER — Other Ambulatory Visit: Payer: Self-pay

## 2019-11-06 ENCOUNTER — Ambulatory Visit (INDEPENDENT_AMBULATORY_CARE_PROVIDER_SITE_OTHER): Payer: Medicare Other | Admitting: Podiatry

## 2019-11-06 DIAGNOSIS — S82892D Other fracture of left lower leg, subsequent encounter for closed fracture with routine healing: Secondary | ICD-10-CM

## 2019-11-06 NOTE — Patient Instructions (Addendum)
Ankle Fracture Rehab Following ankle fractures, a loss of ankle mobility and muscle strength and endurance can occur. The following exercises will help resolve these problems. Ask your health care provider which exercises are safe for you. Do exercises exactly as told by your health care provider and adjust them as directed. It is normal to feel mild stretching, pulling, tightness, or discomfort as you do these exercises. Stop right away if you feel sudden pain or your pain gets worse. Do not begin these exercises until told by your health care provider. Stretching and range-of-motion exercises These exercises warm up your muscles and joints and improve the movement and flexibility of your knee. These exercises may also help to relieve pain and swelling. Passive ankle eversion  This is an exercise in which you use your hands to move your ankle away from you. Your ankle does not move on its own. 1. Sit with your left / right ankle crossed over your opposite knee. 2. Hold your left / right foot with your opposite hand. 3. Gently push the top of your foot downward so your big toe moves away from you and toward the floor (eversion). Keep your lower leg steady. You should feel a gentle stretch on the inside of your ankle. 4. Hold the stretch for 10 seconds. Repeat 10 times. Complete this exercise 2 times a day. Passive ankle inversion  This is an exercise in which you use your hands to move your ankle toward you. Your ankle does not move on its own. 1. Sit with your left / right ankle crossed over your opposite knee. 2. Hold your left / right foot with your opposite hand. 3. Gently pull your foot upward toward you (inversion). Keep your lower leg steady. Your big toe will move toward you and toward the ceiling. You should feel a gentle stretch on the outside of your ankle. 4. Hold the stretch for 10 seconds. Repeat 10 times. Complete this exercise 2 times a day. Ankle alphabet  1. Sit with your left  / right lower leg supported at the lower leg. ? Do not rest your foot on anything. ? Make sure your foot has room to move freely. 2. Think of your left / right foot as a paintbrush. ? Move your foot to trace each letter of the alphabet in the air. Keep your hip and knee still while you trace. ? Make the letters as large as you can without feeling discomfort. 3. Trace every letter from A to Z. Repeat 1 times. Complete this exercise 2 times a day. Gastrocnemius  This exercise is also called calf stretch. It stretches the muscles in the back of the lower leg. 1. Sit on the floor with your left / right leg straight. 2. Loop a belt or towel around the ball of your left / right foot. 3. Keep your left / right ankle and foot relaxed and keep your knee straight while you use the belt or towel to pull your foot and ankle toward you. You should feel a gentle stretch behind your ankle or in your calf. 4. Hold this position for 10 seconds. Repeat 10 times. Complete this exercise 2 times a day. Strengthening exercises These exercises build strength and endurance in your foot and ankle. Endurance is the ability to use your muscles for a long time, even after they get tired. Towel curls  1. Sit in a chair on a non-carpeted surface, and put your feet on the floor. 2. Place a towel in  front of your feet. 3. Keeping your heel on the floor, put your left / right foot on the towel. 4. Pull the towel toward you by grabbing the towel with your toes and curling them under. Keep your heel on the floor. 5. Let your toes relax. 6. Grab the towel with your toes again. Keep going until the towel is completely underneath your foot. Repeat 10 times. Complete this exercise 2 times a day. This information is not intended to replace advice given to you by your health care provider. Make sure you discuss any questions you have with your health care provider. Document Revised: 06/11/2018 Document Reviewed:  11/20/2017 Elsevier Patient Education  2020 [employer].

## 2019-11-11 NOTE — Progress Notes (Signed)
  Subjective:  Patient ID: Denise Conley, female    DOB: Sep 10, 1945,  MRN: [id number]  Chief Complaint  Patient presents with  . Routine Post Op    pov#2 dos 08.13.2021 ORIF Fx Ankle Lt/Poss. Ligament Repair Lt    DOS: 10/17/2019 Procedure: ORIF trimalleolar ankle fracture left with plate and screw fixation of the fibula and deltoid repair  74 y.o. female returns for post-op check.  Pain is significant better than it was.  She is in the postoperative splint again.  Here today to have her staples removed  Review of Systems: Negative except as noted in the HPI. Denies N/V/F/Ch.   Objective:  There were no vitals filed for this visit. There is no height or weight on file to calculate BMI. Constitutional Well developed. Well nourished.  Vascular Foot warm and well perfused. Capillary refill normal to all digits.   Neurologic Normal speech. Oriented to person, place, and time. Epicritic sensation to light touch grossly present bilaterally.  Dermatologic Skin healing well without signs of infection. Skin staples in place with edges well coapted without signs of infection.  Mild to moderate ecchymosis around the surgical sites  Orthopedic: Tenderness to palpation noted about the surgical site.    Assessment:   1. Closed fracture of left ankle with routine healing, subsequent encounter    Plan:  Patient was evaluated and treated and all questions answered.  S/p left ankle ORIF and deltoid ligament repair -Progressing as expected post-operatively. -WB Status: NWB in CAM boot this was dispensed today -After administration of lidocaine ointment every other staple was removed from both incisions.  The lateral incision remained very well coapted and the remaining staples were removed.  The medial incision every other staple was left intact.  Steri-Strips were applied.  Keep dry until next visit. -During the staple removal, she became flush and had a vasovagal response.  She was treated by  myself, my medical assistant and our clinical nurse manager.  She remained conscious throughout the episode and improved significantly within several minutes with a cold ice pack around the forehead and neck and having some cold water.  She felt fine and had no more symptoms when she left the office. -Medications: None refilled -Return in 2 weeks, new x-ray at that visit to evaluate fracture healing   Return in about 2 weeks (around 11/20/2019) for New x-ray.

## 2019-11-20 ENCOUNTER — Other Ambulatory Visit: Payer: Self-pay

## 2019-11-20 ENCOUNTER — Ambulatory Visit (INDEPENDENT_AMBULATORY_CARE_PROVIDER_SITE_OTHER): Payer: Medicare Other | Admitting: Podiatry

## 2019-11-20 ENCOUNTER — Ambulatory Visit (INDEPENDENT_AMBULATORY_CARE_PROVIDER_SITE_OTHER): Payer: Medicare Other

## 2019-11-20 DIAGNOSIS — S82892D Other fracture of left lower leg, subsequent encounter for closed fracture with routine healing: Secondary | ICD-10-CM

## 2019-11-20 DIAGNOSIS — E559 Vitamin D deficiency, unspecified: Secondary | ICD-10-CM

## 2019-11-20 MED ORDER — MUPIROCIN 2 % EX OINT
1.0000 | TOPICAL_OINTMENT | Freq: Two times a day (BID) | CUTANEOUS | 2 refills | Status: DC
Start: 2019-11-20 — End: 2023-03-30

## 2019-11-20 NOTE — Patient Instructions (Signed)
Change the dressing daily with the mupirocin ointment, gauze, and gauze roll in a light dressing  On 9/27, you may begin putting 50% of your body weight on the left ankle in the CAM boot only.  Hold off on completing the range of motion exercises for now

## 2019-11-21 ENCOUNTER — Encounter: Payer: Self-pay | Admitting: Podiatry

## 2019-11-21 LAB — VITAMIN D 25 HYDROXY (VIT D DEFICIENCY, FRACTURES): Vit D, 25-Hydroxy: 28 ng/mL — ABNORMAL LOW (ref 30–100)

## 2019-11-21 LAB — CALCIUM: Calcium: 9.6 mg/dL (ref 8.6–10.4)

## 2019-11-21 MED ORDER — VITAMIN D 25 MCG (1000 UNIT) PO TABS: 1000.0000 [IU] | ORAL_TABLET | Freq: Every day | ORAL | 0 refills | Status: DC

## 2019-11-22 NOTE — Progress Notes (Signed)
  Subjective:  Patient ID: Denise Conley, female    DOB: [birth year],  MRN: [id number]  Chief Complaint  Patient presents with  . Fracture    pov#2 dos 08.13.2021 ORIF Fx Ankle Lt/Poss. Ligament Repair Lt    DOS: 10/17/2019 Procedure: ORIF trimalleolar ankle fracture left with plate and screw fixation of the fibula and deltoid repair  74 y.o. female returns for post-op check.  her pain is improved significantly since last visit.  She has been performing the range of motion exercises.  Review of Systems: Negative except as noted in the HPI. Denies N/V/F/Ch.   Objective:  There were no vitals filed for this visit. There is no height or weight on file to calculate BMI. Constitutional Well developed. Well nourished.  Vascular Foot warm and well perfused. Capillary refill normal to all digits.   Neurologic Normal speech. Oriented to person, place, and time. Epicritic sensation to light touch grossly present bilaterally.  Dermatologic  staples previously removed.  There is some superficial delayed healing of the lateral incision.  Both incisions remain well coapted.  2 staples remain on the medial incision.  No cellulitis or drainage.  No signs of infection.  Orthopedic: Tenderness to palpation noted about the surgical site.  Good early range of motion is noted   3 nonweightbearing views of the left ankle were taken: There is good osseous bridging of the fracture site.  No change in alignment or hardware position Assessment:   1. Closed fracture of left ankle with routine healing, subsequent encounter   2. Hypovitaminosis D   3. Vitamin D deficiency, unspecified     Plan:  Patient was evaluated and treated and all questions answered.  S/p left ankle ORIF and deltoid ligament repair -Progressing as expected post-operatively.  She does have some delayed healing of the lateral incision.  I would like her to begin daily dressing changes with mupirocin ointment. -WB Status: NWB in CAM  boot -Remaining skin staples removed.  Steri-Strips were reapplied. -She has osseous bridging of the fracture site, I had hoped it would be more significant than it is currently.  I will send her for a vitamin D check and supplement if low. -Return in 1 week with Dr. Samuella Cota to evaluate lateral incision.  Like to see her weekly until it heals.   Return in about 8 days (around 11/28/2019) for re-check incision with Dr Samuella Cota.

## 2019-11-28 ENCOUNTER — Other Ambulatory Visit: Payer: Self-pay

## 2019-11-28 ENCOUNTER — Ambulatory Visit (INDEPENDENT_AMBULATORY_CARE_PROVIDER_SITE_OTHER): Payer: Medicare Other | Admitting: Podiatry

## 2019-11-28 DIAGNOSIS — S82892D Other fracture of left lower leg, subsequent encounter for closed fracture with routine healing: Secondary | ICD-10-CM

## 2019-11-28 DIAGNOSIS — Z9889 Other specified postprocedural states: Secondary | ICD-10-CM

## 2019-11-28 MED ORDER — DOXYCYCLINE HYCLATE 100 MG PO TABS
100.0000 mg | ORAL_TABLET | Freq: Two times a day (BID) | ORAL | 0 refills | Status: DC
Start: 2019-11-28 — End: 2019-12-05

## 2019-12-01 NOTE — Progress Notes (Signed)
  Subjective:  Patient ID: Denise Conley, female    DOB: 01-24-1946,  MRN: [id number]  Chief Complaint  Patient presents with  . Routine Post Op    POV#3 Pt states healing well without any concerns. Denies fever/chills/nausea/vomiting. Some redness noted at the lateral incision.     DOS: 10/17/19 Procedure: ORIF Left ankle   74 y.o. female presents with the above complaint. History confirmed with patient. States that the lateral aspect   Objective:  Physical Exam: tenderness at the surgical site, local edema noted and calf supple, nontender. Incision: healing well, some area of dehiscence with overlying fibrosis. Slight erythema with a 95mm margin  Assessment:   1. Closed fracture of left ankle with routine healing, subsequent encounter   2. Post-operative state     Plan:  Patient was evaluated and treated and all questions answered.  Post-operative State -Wound cleansed. No debridement indicated. Dressed with abx ointment and DSD.  -Continue mupirocin daily. -Given erythema rx doxycycline. Patient to call should this worsen.  Return in about 1 week (around 12/05/2019) for post-op ([place]).

## 2019-12-02 ENCOUNTER — Telehealth: Payer: Self-pay

## 2019-12-02 NOTE — Telephone Encounter (Signed)
Pt has an infection in her incision on her left ankle. Pt stated that she saw Dr. Samuella Cota last Friday. And was given an antibiotics for 3 days. Pt is still having problems with foot. Left foot is still red and swollen. Pt has an appointment with Dr. Lilian Kapur on Friday at 1pm. Pt would like to know if she needs more antibiotics until then. Please advise.

## 2019-12-05 ENCOUNTER — Ambulatory Visit (INDEPENDENT_AMBULATORY_CARE_PROVIDER_SITE_OTHER): Payer: Medicare Other | Admitting: Podiatry

## 2019-12-05 ENCOUNTER — Other Ambulatory Visit: Payer: Self-pay

## 2019-12-05 DIAGNOSIS — Z9889 Other specified postprocedural states: Secondary | ICD-10-CM

## 2019-12-05 DIAGNOSIS — S82892D Other fracture of left lower leg, subsequent encounter for closed fracture with routine healing: Secondary | ICD-10-CM

## 2019-12-05 DIAGNOSIS — E559 Vitamin D deficiency, unspecified: Secondary | ICD-10-CM

## 2019-12-05 DIAGNOSIS — T8189XD Other complications of procedures, not elsewhere classified, subsequent encounter: Secondary | ICD-10-CM

## 2019-12-05 MED ORDER — DOXYCYCLINE HYCLATE 100 MG PO TABS: 100.0000 mg | ORAL_TABLET | Freq: Two times a day (BID) | ORAL | 0 refills | Status: DC

## 2019-12-05 MED ORDER — SANTYL 250 UNIT/GM EX OINT
1.0000 | TOPICAL_OINTMENT | Freq: Every day | CUTANEOUS | 0 refills | Status: DC
Start: 2019-12-05 — End: 2023-03-30

## 2019-12-05 NOTE — Patient Instructions (Signed)
Discuss vitamin D supplementation with your primary care doctor  For the wound: once daily apply a nickel thick layer of Santyl (collagenase) ointment to the wound, followed by saline moistened gauze, and dry gauze and an ACE wrap

## 2019-12-06 ENCOUNTER — Encounter: Payer: Self-pay | Admitting: Podiatry

## 2019-12-06 NOTE — Progress Notes (Signed)
  Subjective:  Patient ID: Denise Conley, female    DOB: 1945/04/09,  MRN: [id number]  Chief Complaint  Patient presents with  . Routine Post Op    POV 4 infection follow-up    DOS: 10/17/2019 Procedure: ORIF trimalleolar ankle fracture left with plate and screw fixation of the fibula and deltoid repair  74 y.o. female returns for post-op check.  Minimal pain.  Has been changing daily with mupirocin. She saw Dr Samuella Cota last week and was on doxycycline for 3 days with improvement.  Review of Systems: Negative except as noted in the HPI. Denies N/V/F/Ch.   Objective:  There were no vitals filed for this visit. There is no height or weight on file to calculate BMI. Constitutional Well developed. Well nourished.  Vascular Foot warm and well perfused. Capillary refill normal to all digits.   Neurologic Normal speech. Oriented to person, place, and time. Epicritic sensation to light touch grossly present bilaterally.  Dermatologic  staples previously removed.  There is some superficial delayed healing of the lateral incision, this is improved since my last exam and has coalesced into an eschar.  No deep dehiscence. No cellulitis or drainage.  No signs of infection. Medial incision nearly completely healed.   Orthopedic: Less pain today w/ good ROM of ankle    Assessment:   1. Closed fracture of left ankle with routine healing, subsequent encounter   2. Post-operative state   3. Hypovitaminosis D   4. Delayed surgical wound healing, subsequent encounter    Plan:  Patient was evaluated and treated and all questions answered.  S/p left ankle ORIF and deltoid ligament repair -We will switch from mupirocin to collagenase ointment, reviewed application instructions with her she will apply daily with saline moistened gauze.  Rx was sent to her pharmacy -Continue doxycycline new prescription was sent to pharmacy for twice daily x14 days -Surgical site redressed with antibiotic ointment and  dry sterile gauze dressings -WB Status: NWB in CAM boot -Currently on 2000 units daily of vitamin D.  She is to see her PCP on Monday and will discuss with them if she needs a injection boost dose.  She had mild insufficiency and normal calcium levels -I will reevaluate her in 1 week we will take new radiographs at next visit to evaluate fracture healing  Return in about 6 days (around 12/11/2019) for new x-ray at next visit.

## 2019-12-11 ENCOUNTER — Other Ambulatory Visit: Payer: Self-pay

## 2019-12-11 ENCOUNTER — Ambulatory Visit (INDEPENDENT_AMBULATORY_CARE_PROVIDER_SITE_OTHER): Payer: Medicare Other | Admitting: Podiatry

## 2019-12-11 ENCOUNTER — Ambulatory Visit (INDEPENDENT_AMBULATORY_CARE_PROVIDER_SITE_OTHER): Payer: Medicare Other

## 2019-12-11 DIAGNOSIS — S82892D Other fracture of left lower leg, subsequent encounter for closed fracture with routine healing: Secondary | ICD-10-CM | POA: Diagnosis not present

## 2019-12-11 DIAGNOSIS — E559 Vitamin D deficiency, unspecified: Secondary | ICD-10-CM

## 2019-12-11 DIAGNOSIS — Z9889 Other specified postprocedural states: Secondary | ICD-10-CM

## 2019-12-11 DIAGNOSIS — T8189XD Other complications of procedures, not elsewhere classified, subsequent encounter: Secondary | ICD-10-CM

## 2019-12-11 NOTE — Progress Notes (Signed)
  Subjective:  Patient ID: Denise Conley, female    DOB: 12/17/1945,  MRN: [id number]  Chief Complaint  Patient presents with  . Routine Post Op    Pt stated that she is still unable to put weight on it and feels like it is infected     DOS: 10/17/2019 Procedure: ORIF trimalleolar ankle fracture left with plate and screw fixation of the fibula and deltoid repair  74 y.o. female returns for post-op wound check.  Minimal pain.  She is taking the doxycycline and has been applying the Santyl ointment daily with moistened gauze  Review of Systems: Negative except as noted in the HPI. Denies N/V/F/Ch.   Objective:  There were no vitals filed for this visit. There is no height or weight on file to calculate BMI. Constitutional Well developed. Well nourished.  Vascular Foot warm and well perfused. Capillary refill normal to all digits.   Neurologic Normal speech. Oriented to person, place, and time. Epicritic sensation to light touch grossly present bilaterally.  Dermatologic  medial incision has fully healed.  Improvement in wound of lateral incision.  Eschar remains.  No signs of infection.  Mild periwound erythema.  No drainage.  Orthopedic:  Minimal pain today w/ good ROM of ankle      Radiographs: 3 WB views of the left ankle taken.  Fracture sites have healed without complication of hardware Assessment:   1. Closed fracture of left ankle with routine healing, subsequent encounter   2. Delayed surgical wound healing, subsequent encounter   3. Hypovitaminosis D   4. Post-operative state    Plan:  Patient was evaluated and treated and all questions answered.  S/p left ankle ORIF and deltoid ligament repair -She is getting increased supplementation of vitamin D from her PCP -Transition from NWB to WBAT in the CAM boot.  The 30-minute rule was reviewed with her in detail. -Continue Santyl to incision daily -At the end of the visit she did mention to me that she has had issues  with jewelry that has nickel in it previously, the titanium elevated the plates and screws are made of contains nickel.  This could be one reason that she has had 7 delayed healing problems with the incision.  Considering this and the fact that she now has a chronic wound, as well as consolidated fracture sites, if she does not exhibit significant healing in the next month I think we should remove the plates and screws and excise the wounds.  Return in about 2 weeks (around 12/25/2019) for wound re-check.

## 2019-12-11 NOTE — Patient Instructions (Signed)
  30 Minute Rule:  Begin to transition to more weight in the CAM boot on the left leg with normal daily activities (e.g. going to work or running errands, walking around the house  If you have pain and swelling for more than 30 minutes following that activity, it's likely too much too soon and should decrease your distance/activity/weight/time the next time you do it  If you have some pain and swelling but doesn't last more than 30 minutes, that activity is OK and you can begin to increase your distance/activity/weight/time the next time you do it  Continue apply the collagenase daily with saline moistened gauze dressings

## 2019-12-12 ENCOUNTER — Encounter: Payer: Self-pay | Admitting: Podiatry

## 2019-12-12 MED ORDER — DOXYCYCLINE HYCLATE 100 MG PO TABS: 100.0000 mg | ORAL_TABLET | Freq: Two times a day (BID) | ORAL | 0 refills | Status: DC

## 2019-12-24 LAB — COLOGUARD: COLOGUARD: POSITIVE — AB

## 2019-12-25 ENCOUNTER — Other Ambulatory Visit: Payer: Self-pay

## 2019-12-25 ENCOUNTER — Ambulatory Visit (INDEPENDENT_AMBULATORY_CARE_PROVIDER_SITE_OTHER): Payer: Medicare Other | Admitting: Podiatry

## 2019-12-25 ENCOUNTER — Ambulatory Visit (INDEPENDENT_AMBULATORY_CARE_PROVIDER_SITE_OTHER): Payer: Medicare Other

## 2019-12-25 DIAGNOSIS — S82892D Other fracture of left lower leg, subsequent encounter for closed fracture with routine healing: Secondary | ICD-10-CM

## 2019-12-25 DIAGNOSIS — T8189XD Other complications of procedures, not elsewhere classified, subsequent encounter: Secondary | ICD-10-CM

## 2019-12-25 NOTE — Progress Notes (Signed)
  Subjective:  Patient ID: Denise Conley, female    DOB: 1945/07/08,  MRN: [id number]  Chief Complaint  Patient presents with  . Routine Post Op    PT stated that she is very aggravated with the healing process and when she uses the ointment it burns and its like a achy feeling like a toothache. PT stated that she doesnt think the incison site is healing     DOS: 10/17/2019 Procedure: ORIF trimalleolar ankle fracture left with plate and screw fixation of the fibula and deltoid repair  74 y.o. female returns for post-op wound check.  The incision is painful to touch.  She has become frustrated with the progress of her wound healing.  Review of Systems: Negative except as noted in the HPI. Denies N/V/F/Ch.   Objective:  There were no vitals filed for this visit. There is no height or weight on file to calculate BMI. Constitutional Well developed. Well nourished.  Vascular Foot warm and well perfused. Capillary refill normal to all digits.   Neurologic Normal speech. Oriented to person, place, and time. Epicritic sensation to light touch grossly present bilaterally.  Dermatologic  improvement in character of the wound, dimensions appear to be relatively similar to previous.  Orthopedic:  Minimal pain today w/ good ROM of ankle        Assessment:   1. Closed fracture of left ankle with routine healing, subsequent encounter   2. Delayed surgical wound healing, subsequent encounter    Plan:  Patient was evaluated and treated and all questions answered.  S/p left ankle ORIF and deltoid ligament repair -WBAT in CAM boot -Continue Santyl to incision daily -I discussed with her at this point we could continue with local wound care, perform debridement of the wound with application of an amniotic graft or skin substitute, or potentially remove her hardware given that her fractures have healed and closed the wound primarily.  Bleed which should go ahead and proceed with the latter.   She will consider this.  We will tentatively schedule this for January 09, 2020.  Booking request was sent to the operating room.  She will return to see me on November 2 for reevaluation and to sign surgical consent if necessary.  if there is significant wound healing progress we will delay.  No follow-ups on file.

## 2019-12-30 ENCOUNTER — Encounter: Payer: Medicare Other | Admitting: Podiatry

## 2020-01-06 ENCOUNTER — Other Ambulatory Visit: Payer: Self-pay

## 2020-01-06 ENCOUNTER — Ambulatory Visit (INDEPENDENT_AMBULATORY_CARE_PROVIDER_SITE_OTHER): Payer: Medicare Other | Admitting: Podiatry

## 2020-01-06 DIAGNOSIS — T8189XD Other complications of procedures, not elsewhere classified, subsequent encounter: Secondary | ICD-10-CM | POA: Diagnosis not present

## 2020-01-06 DIAGNOSIS — Z9889 Other specified postprocedural states: Secondary | ICD-10-CM | POA: Diagnosis not present

## 2020-01-06 DIAGNOSIS — L97322 Non-pressure chronic ulcer of left ankle with fat layer exposed: Secondary | ICD-10-CM

## 2020-01-06 DIAGNOSIS — S82892D Other fracture of left lower leg, subsequent encounter for closed fracture with routine healing: Secondary | ICD-10-CM

## 2020-01-06 NOTE — Progress Notes (Signed)
  Subjective:  Patient ID: Denise Conley, female    DOB: Sep 12, 1945,  MRN: [id number]  Chief Complaint  Patient presents with  . Routine Post Op    PT stated that she is doing great she voices no concerns at this time. PT stated that she is walking more and driving she does have the ocasional pain here and there but nothing major     DOS: 10/17/2019 Procedure: ORIF trimalleolar ankle fracture left with plate and screw fixation of the fibula and deltoid repair  74 y.o. female returns for post-op wound check.  She has quite a bit of improvement in her's pain and symptoms.  She still changing the dressing with Santyl daily.  Today she is ambulating in the CAM boot and has no pain in the ankle.  She recent was was to go undergo colonoscopy, and instead had to have a Cologuard test which the results came back positive, she is not sure what the neck steps will be.  She is being connected with a doctor locally here in [place].  She would like to avoid surgery on the ankle  Review of Systems: Negative except as noted in the HPI. Denies N/V/F/Ch.   Objective:  There were no vitals filed for this visit. There is no height or weight on file to calculate BMI. Constitutional Well developed. Well nourished.  Vascular Foot warm and well perfused. Capillary refill normal to all digits.   Neurologic Normal speech. Oriented to person, place, and time. Epicritic sensation to light touch grossly present bilaterally.  Dermatologic  fibrogranular base, measurements today 6 cm x 0.8 cm x 0.3 cm.  No malodor or signs infection.  Minimal drainage.  Orthopedic:  No pain today w/ good ROM of ankle          Assessment:   No diagnosis found. Plan:  Patient was evaluated and treated and all questions answered.  S/p left ankle ORIF and deltoid ligament repair -Discussed with her that I think it is reasonable that we avoid removal of the hardware and excision of the wound given her ongoing work-up for  colon cancer injury preferred avoid further surgery at this time  -Since she would not undergo operative hardware removal and wound excision, I recommend we try to get this wound healed as fast as possible with advanced wound healing products in the office here.  I am submitting her insurance for authorization for an amniotic wound graft and the first of these graft was applied today in the office following a debridement.  A 2 x 2 Artacent amniotic graft was applied to the wound bed after debridement of the wound of nonviable tissue slough using a sterile ring curette.  It was dressed with Adaptic, DSD, and an Ace wrap.  She will change the dressing in 48 hours and I will see her again in 1 week for further applications  Return in 1 week (on 01/13/2020) for Graft application to wound.

## 2020-01-08 ENCOUNTER — Encounter: Payer: Self-pay | Admitting: Podiatry

## 2020-01-09 ENCOUNTER — Encounter: Payer: Self-pay | Admitting: Gastroenterology

## 2020-01-13 ENCOUNTER — Encounter: Payer: Medicare Other | Admitting: Podiatry

## 2020-01-13 ENCOUNTER — Encounter: Payer: Self-pay | Admitting: Podiatry

## 2020-01-13 ENCOUNTER — Ambulatory Visit (INDEPENDENT_AMBULATORY_CARE_PROVIDER_SITE_OTHER): Payer: Medicare Other | Admitting: Podiatry

## 2020-01-13 ENCOUNTER — Other Ambulatory Visit: Payer: Self-pay

## 2020-01-13 DIAGNOSIS — T8189XD Other complications of procedures, not elsewhere classified, subsequent encounter: Secondary | ICD-10-CM

## 2020-01-13 DIAGNOSIS — S82892D Other fracture of left lower leg, subsequent encounter for closed fracture with routine healing: Secondary | ICD-10-CM

## 2020-01-13 DIAGNOSIS — Z9889 Other specified postprocedural states: Secondary | ICD-10-CM

## 2020-01-13 DIAGNOSIS — L97322 Non-pressure chronic ulcer of left ankle with fat layer exposed: Secondary | ICD-10-CM | POA: Diagnosis not present

## 2020-01-13 NOTE — Progress Notes (Signed)
  Subjective:  Patient ID: Denise Conley, female    DOB: 1945-11-11,  MRN: [id number]  Chief Complaint  Patient presents with  . Routine Post Op    Pt stated that she is doing good but her ankle aches all the time. she has no concerns at this time     DOS: 10/17/2019 Procedure: ORIF trimalleolar ankle fracture left with plate and screw fixation of the fibula and deltoid repair  74 y.o. female returns for post-op wound check.  She notes some soreness but overall is doing much better.  She is ambulating in regular shoe gear now.  She change the dressing once.  Minimal drainage.  Review of Systems: Negative except as noted in the HPI. Denies N/V/F/Ch.   Objective:  There were no vitals filed for this visit. There is no height or weight on file to calculate BMI. Constitutional Well developed. Well nourished.  Vascular Foot warm and well perfused. Capillary refill normal to all digits.   Neurologic Normal speech. Oriented to person, place, and time. Epicritic sensation to light touch grossly present bilaterally.  Dermatologic  fibrogranular base, measurements today 3.8 x 0.8 x 0.3cm for the superior wound and 1.2 x 0.5 x 0.1cm for the inferior wound.  No malodor or signs infection.  Minimal drainage.  Orthopedic:  No pain today w/ good ROM of ankle          Assessment:   1. Closed fracture of left ankle with routine healing, subsequent encounter   2. Post-operative state   3. Delayed surgical wound healing, subsequent encounter    Plan:  Patient was evaluated and treated and all questions answered.  S/p left ankle ORIF and deltoid ligament repair -Discussed with her that I think it is reasonable that we avoid removal of the hardware and excision of the wound given her ongoing work-up for colon cancer injury preferred avoid further surgery at this time  -Since she would not undergo operative hardware removal and wound excision, I recommend we try to get this wound healed as  fast as possible with advanced wound healing products in the office here.  I am submitting her insurance for authorization for an amniotic wound graft and the first of these graft was applied today in the office following a debridement.  A 2 x 2 Artacent amniotic graft was applied to the wound bed after debridement of the wound of nonviable tissue slough using a sterile ring curette.  It was dressed with Adaptic, and a foam border dressing.  She will change the dressing in 72 hours and I will see her again in 1 week for further applications  Return in 1 week (on 01/20/2020).

## 2020-01-13 NOTE — Patient Instructions (Signed)
Leave the dressing intact until Friday or Saturday, then change (leave the oiled gauze layer in place) and re apply dry gauze dressings. If significant drainage, change sooner.  Monitor for any signs/symptoms of infection. Signs of an infection could be redness beyond the site of the incision/procedure/wound, foul smelling odor, drainage that is thick and yellow or green, or severe swelling and pain. Call the office immediately if any occur or go directly to the emergency room. Call with any questions/concerns.

## 2020-01-16 ENCOUNTER — Encounter: Payer: Medicare Other | Admitting: Podiatry

## 2020-01-20 ENCOUNTER — Ambulatory Visit (INDEPENDENT_AMBULATORY_CARE_PROVIDER_SITE_OTHER): Payer: Medicare Other | Admitting: Podiatry

## 2020-01-20 ENCOUNTER — Other Ambulatory Visit: Payer: Self-pay

## 2020-01-20 ENCOUNTER — Encounter: Payer: Medicare Other | Admitting: Podiatry

## 2020-01-20 ENCOUNTER — Encounter: Payer: Self-pay | Admitting: Podiatry

## 2020-01-20 DIAGNOSIS — S82892D Other fracture of left lower leg, subsequent encounter for closed fracture with routine healing: Secondary | ICD-10-CM

## 2020-01-20 DIAGNOSIS — Z9889 Other specified postprocedural states: Secondary | ICD-10-CM

## 2020-01-20 DIAGNOSIS — L97322 Non-pressure chronic ulcer of left ankle with fat layer exposed: Secondary | ICD-10-CM

## 2020-01-20 DIAGNOSIS — T8189XD Other complications of procedures, not elsewhere classified, subsequent encounter: Secondary | ICD-10-CM

## 2020-01-20 DIAGNOSIS — E559 Vitamin D deficiency, unspecified: Secondary | ICD-10-CM

## 2020-01-20 NOTE — Progress Notes (Signed)
  Subjective:  Patient ID: Denise Conley, female    DOB: Mar 12, 1945,  MRN: [id number]  Chief Complaint  Patient presents with  . Routine Post Op    PT stated that she doesnt see a change and the place still feels achy and its really itchy     DOS: 10/17/2019 Procedure: ORIF trimalleolar ankle fracture left with plate and screw fixation of the fibula and deltoid repair  74 y.o. female returns for post-op wound check.  She notes some soreness but overall is doing much better.  She is ambulating in regular shoe gear now.  She change the dressing once.  Minimal drainage.  Review of Systems: Negative except as noted in the HPI. Denies N/V/F/Ch.   Objective:  There were no vitals filed for this visit. There is no height or weight on file to calculate BMI. Constitutional Well developed. Well nourished.  Vascular Foot warm and well perfused. Capillary refill normal to all digits.   Neurologic Normal speech. Oriented to person, place, and time. Epicritic sensation to light touch grossly present bilaterally.  Dermatologic  fibrogranular base, measurements today 4.0 x 0.5 x 0.3cm for the superior wound and 1.0 x 0.4 x 0.2cm for the inferior wound.  No malodor or signs infection.  Minimal drainage.  Orthopedic:  No pain today w/ good ROM of ankle          Assessment:   1. 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 Closed fracture of left ankle with routine healing, subsequent encounter   3. Post-operative state   4. Delayed surgical wound healing, subsequent encounter   5. Hypovitaminosis D    Plan:  Patient was evaluated and treated and all questions answered.  S/p left ankle ORIF and deltoid ligament repair -Discussed with her that I think it is reasonable that we avoid removal of the hardware and excision of the wound given her ongoing work-up for colon cancer injury preferred avoid further surgery at this time -Continue weightbearing as tolerated  in regular shoe gear  - A 2 x 2 Artacent amniotic graft was applied to the wound bed after debridement of the wound of nonviable tissue slough using a sterile ring curette.  It was dressed with Adaptic, and a foam border dressing.  She will change the dressing in 72 hours and I will see her again in 1 week for further applications  She will be out of town next week, after changing on Saturday she will apply Prisma, DSD, until she runs out of this and then apply mupirocin ointment.  Return in 2 weeks (on 02/03/2020) for Graft application to wound.

## 2020-01-20 NOTE — Patient Instructions (Addendum)
Change the dressing on Friday, apply the adaptic gauze, then clean bandage. Then change every other day with the Prisma collagen dressing. Use just enough to cover the wound. Apply adaptic over the top of this and a bandage  When you run out, return to using mupirocin daily

## 2020-01-23 ENCOUNTER — Encounter: Payer: Medicare Other | Admitting: Podiatry

## 2020-01-27 ENCOUNTER — Encounter: Payer: Medicare Other | Admitting: Podiatry

## 2020-02-03 ENCOUNTER — Ambulatory Visit (INDEPENDENT_AMBULATORY_CARE_PROVIDER_SITE_OTHER): Payer: Medicare Other | Admitting: Podiatry

## 2020-02-03 ENCOUNTER — Encounter: Payer: Medicare Other | Admitting: Podiatry

## 2020-02-03 ENCOUNTER — Other Ambulatory Visit: Payer: Self-pay

## 2020-02-03 DIAGNOSIS — L97322 Non-pressure chronic ulcer of left ankle with fat layer exposed: Secondary | ICD-10-CM | POA: Diagnosis not present

## 2020-02-04 ENCOUNTER — Encounter: Payer: Self-pay | Admitting: Podiatry

## 2020-02-04 NOTE — Progress Notes (Signed)
  Subjective:  Patient ID: Denise Conley, female    DOB: Jan 02, 1946,  MRN: [id number]  Chief Complaint  Patient presents with  . Routine Post Op    Pt stated that she is doing well she has no pain but is having some itchiness around the site.    DOS: 10/17/2019 Procedure: ORIF trimalleolar ankle fracture left with plate and screw fixation of the fibula and deltoid repair  74 y.o. female returns for post-op wound check.  Notes some improvement.  Has not had much drainage.  Review of Systems: Negative except as noted in the HPI. Denies N/V/F/Ch.   Objective:  There were no vitals filed for this visit. There is no height or weight on file to calculate BMI. Constitutional Well developed. Well nourished.  Vascular Foot warm and well perfused. Capillary refill normal to all digits.   Neurologic Normal speech. Oriented to person, place, and time. Epicritic sensation to light touch grossly present bilaterally.  Dermatologic  fibrogranular base, measurements today 4.0 x 0.8 x 0.3cm for the superior wound and 1.2 x 0.4 x 0.2cm for the inferior wound.  No malodor or signs infection.  Minimal drainage.  Orthopedic:  No pain today w/ good ROM of ankle            Assessment:   1. 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  S/p left ankle ORIF and deltoid ligament repair -Discussed with her that I think it is reasonable that we avoid removal of the hardware and excision of the wound given her ongoing work-up for colon cancer  -Continue weightbearing as tolerated in regular shoe gear  - A 2 x 2 Artacent amniotic graft was applied to the wound bed after debridement of the wound of nonviable tissue slough using a sterile ring curette.  It was dressed with Adaptic, and a gauze dressing.  She will change the dressing in 72 hours and I will see her again in 1 week for further  applications  No follow-ups on file.

## 2020-02-06 ENCOUNTER — Encounter: Payer: Medicare Other | Admitting: Podiatry

## 2020-02-10 ENCOUNTER — Ambulatory Visit (INDEPENDENT_AMBULATORY_CARE_PROVIDER_SITE_OTHER): Payer: Medicare Other | Admitting: Podiatry

## 2020-02-10 ENCOUNTER — Other Ambulatory Visit: Payer: Self-pay

## 2020-02-10 DIAGNOSIS — L97322 Non-pressure chronic ulcer of left ankle with fat layer exposed: Secondary | ICD-10-CM | POA: Diagnosis not present

## 2020-02-10 DIAGNOSIS — S82892D Other fracture of left lower leg, subsequent encounter for closed fracture with routine healing: Secondary | ICD-10-CM

## 2020-02-13 NOTE — Progress Notes (Signed)
  Subjective:  Patient ID: Denise Conley, female    DOB: 07-01-1945,  MRN: [id number]  Chief Complaint  Patient presents with  . Routine Post Op    PT stated that she is doing well. She has no concerns at this time and denies any pain but stated she does have some itchiness     DOS: 10/17/2019 Procedure: ORIF trimalleolar ankle fracture left with plate and screw fixation of the fibula and deltoid repair  74 y.o. female returns for post-op wound check.  After last visit change the dressing at 72 hours and is applying mupirocin daily since then.  Feels like it is improving.  Review of Systems: Negative except as noted in the HPI. Denies N/V/F/Ch.   Objective:  There were no vitals filed for this visit. There is no height or weight on file to calculate BMI. Constitutional Well developed. Well nourished.  Vascular Foot warm and well perfused. Capillary refill normal to all digits.   Neurologic Normal speech. Oriented to person, place, and time. Epicritic sensation to light touch grossly present bilaterally.  Dermatologic  fibrogranular base, measurements today 3.5 cm x 0.6 cm x 0.5 cm for the superior wound, the inferior wound is largely healed with a scab overlying it now.  No malodor or signs infection.  Minimal drainage.  Orthopedic:  No pain today w/ good ROM of ankle              Assessment:   1. 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 Closed fracture of left ankle with routine healing, subsequent encounter    Plan:  Patient was evaluated and treated and all questions answered.  S/p left ankle ORIF and deltoid ligament repair -Discussed with her that I think it is reasonable that we avoid removal of the hardware and excision of the wound given her ongoing work-up for colon cancer.  She has an upcoming colonoscopy and she will have more information after this.  May discuss Integra bilayer application if there is no  improvement. -Continue weightbearing as tolerated in regular shoe gear  - A 2 x 2 Artacent amniotic graft was applied to the wound bed after debridement of the wound of nonviable tissue slough using a sterile ring curette.  It was dressed with Adaptic, and a gauze dressing.  She will change the dressing in 72 hours and I will see her again in 1 week for further applications  No follow-ups on file.

## 2020-02-16 ENCOUNTER — Other Ambulatory Visit: Payer: Self-pay | Admitting: Podiatry

## 2020-02-16 NOTE — Telephone Encounter (Signed)
Please advise 

## 2020-02-17 ENCOUNTER — Encounter: Payer: Medicare Other | Admitting: Podiatry

## 2020-02-24 ENCOUNTER — Other Ambulatory Visit: Payer: Self-pay

## 2020-02-24 ENCOUNTER — Ambulatory Visit (INDEPENDENT_AMBULATORY_CARE_PROVIDER_SITE_OTHER): Payer: Medicare Other | Admitting: Podiatry

## 2020-02-24 DIAGNOSIS — L97322 Non-pressure chronic ulcer of left ankle with fat layer exposed: Secondary | ICD-10-CM | POA: Diagnosis not present

## 2020-02-26 ENCOUNTER — Encounter: Payer: Medicare Other | Admitting: Gastroenterology

## 2020-02-27 NOTE — Progress Notes (Signed)
  Subjective:  Patient ID: Denise Conley, female    DOB: [birth year],  MRN: [id number]  Chief Complaint  Patient presents with  . Wound Check    F/U LT ankle ulcer check Pt states," looks about the same.:" - w/ litlte improvement - no pain -w/ slight redness, yellow draiange  no swelling or odor Tx: mupirocin ointment and daily dressing change    . Pain    Pt c/o pain at Rt hallux joint x 1 wk -w/ rednes, swelling and pain; 2/10 soreness today  no injury tx: inomethacin (Rx by PCP)     DOS: 10/17/2019 Procedure: ORIF trimalleolar ankle fracture left with plate and screw fixation of the fibula and deltoid repair  74 y.o. female returns for post-op wound check.  After last visit change the dressing at 72 hours and is applying mupirocin daily since then.  Feels like it is improving.  Her colonoscopy had showed some small benign polyps that were removed uneventfully.  Review of Systems: Negative except as noted in the HPI. Denies N/V/F/Ch.   Objective:  There were no vitals filed for this visit. There is no height or weight on file to calculate BMI. Constitutional Well developed. Well nourished.  Vascular Foot warm and well perfused. Capillary refill normal to all digits.   Neurologic Normal speech. Oriented to person, place, and time. Epicritic sensation to light touch grossly present bilaterally.  Dermatologic  fibrogranular base, measurements today 3.3 cm x 0.5 cm x 0.5 cm for the superior wound, the inferior wound is largely healed with a scab overlying it now.  No malodor or signs infection.  Minimal drainage.  Orthopedic:  No pain today w/ good ROM of ankle                    Assessment:   No diagnosis found. Plan:  Patient was evaluated and treated and all questions answered.  S/p left ankle ORIF and deltoid ligament repair  -Continue weightbearing as tolerated in regular shoe gear  - A 2 x 2 Artacent amniotic graft was applied to the wound bed after  debridement of the wound of nonviable tissue slough using a sterile ring curette.  It was dressed with Adaptic, and a gauze dressing.  She will change the dressing in 72 hours and I will see her again in 1 week for further applications  Return in about 2 weeks (around 03/09/2020) for Graft application to wound.

## 2020-03-09 ENCOUNTER — Ambulatory Visit (INDEPENDENT_AMBULATORY_CARE_PROVIDER_SITE_OTHER): Payer: Medicare Other | Admitting: Podiatry

## 2020-03-09 ENCOUNTER — Other Ambulatory Visit: Payer: Self-pay

## 2020-03-09 DIAGNOSIS — Z978 Presence of other specified devices: Secondary | ICD-10-CM | POA: Diagnosis not present

## 2020-03-09 DIAGNOSIS — L97322 Non-pressure chronic ulcer of left ankle with fat layer exposed: Secondary | ICD-10-CM

## 2020-03-10 ENCOUNTER — Encounter: Payer: Self-pay | Admitting: Podiatry

## 2020-03-10 NOTE — Progress Notes (Signed)
  Subjective:  Patient ID: Denise Conley, female    DOB: [birth year],  MRN: [id number]  Chief Complaint  Patient presents with  . Routine Post Op    2wk POV for Graft application to wound-no est pt slots avail    DOS: 10/17/2019 Procedure: ORIF trimalleolar ankle fracture left with plate and screw fixation of the fibula and deltoid repair  75 y.o. female returns for post-op wound check.  So far she feels like there has not been much improvement since last visit.  She has been frustrated with the pace of the wound healing  Review of Systems: Negative except as noted in the HPI. Denies N/V/F/Ch.   Objective:  There were no vitals filed for this visit. There is no height or weight on file to calculate BMI. Constitutional Well developed. Well nourished.  Vascular Foot warm and well perfused. Capillary refill normal to all digits.   Neurologic Normal speech. Oriented to person, place, and time. Epicritic sensation to light touch grossly present bilaterally.  Dermatologic  fibrogranular base, measurements today 3.0 cm x 0.4 cm x 0.5 cm for the superior wound, the inferior wound is largely healed with a scab overlying it now.  No malodor or signs infection.  Minimal drainage.  Orthopedic:  No pain today w/ good ROM of ankle     Assessment:   1. Orthopedic hardware present   2. 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  S/p left ankle ORIF and deltoid ligament repair  -Today we discussed further treatments with grafting, other advanced wound strategies including Integra bilayer application and then further grafting.  We also discussed removal of the hardware with excision or closure of the wound as we have previously discussed prior to her colonoscopy.  She would like to proceed with hardware removal.  The plate is palpable under her skin as it is and I think removing the plate would  allow enough tissue laxity to feel to primarily close the wound.  We also discussed if we cannot close it I would consider full-thickness skin grafting with a sinus tarsi pinch graft.  We discussed the risks of proceeding with this including further delayed wound healing given her history of this.  She would like to proceed with surgery.  Informed consent was signed and reviewed will be scheduled in the next few weeks   Surgical plan:  Procedure: -Removal of hardware left ankle, wound closure and excision, possible skin graft  Location: -[facility]  Anesthesia plan: -General anesthesia with regional block  Postoperative pain plan: - Tylenol 1000 mg every 6 hours, ibuprofen 600 mg every 6 hours, gabapentin 300 mg every 8 hours x5 days, oxycodone 5 mg 1-2 tabs every 6 hours only as needed  DVT prophylaxis: -We will be WBAT in CAM boot  WB Restrictions / DME needs: -She has her CAM boot and she will be WBAT in this   No follow-ups on file.

## 2020-03-22 ENCOUNTER — Encounter: Payer: Self-pay | Admitting: Podiatry

## 2020-03-23 ENCOUNTER — Encounter: Payer: Medicare Other | Admitting: Podiatry

## 2020-03-26 DIAGNOSIS — L97322 Non-pressure chronic ulcer of left ankle with fat layer exposed: Secondary | ICD-10-CM

## 2020-03-26 DIAGNOSIS — Z4889 Encounter for other specified surgical aftercare: Secondary | ICD-10-CM

## 2020-03-27 ENCOUNTER — Encounter: Payer: Self-pay | Admitting: Podiatry

## 2020-04-01 ENCOUNTER — Ambulatory Visit (INDEPENDENT_AMBULATORY_CARE_PROVIDER_SITE_OTHER): Payer: Medicare Other | Admitting: Podiatry

## 2020-04-01 ENCOUNTER — Ambulatory Visit (INDEPENDENT_AMBULATORY_CARE_PROVIDER_SITE_OTHER): Payer: Medicare Other

## 2020-04-01 ENCOUNTER — Other Ambulatory Visit: Payer: Self-pay

## 2020-04-01 DIAGNOSIS — L97322 Non-pressure chronic ulcer of left ankle with fat layer exposed: Secondary | ICD-10-CM

## 2020-04-01 DIAGNOSIS — S82892D Other fracture of left lower leg, subsequent encounter for closed fracture with routine healing: Secondary | ICD-10-CM

## 2020-04-01 DIAGNOSIS — Z978 Presence of other specified devices: Secondary | ICD-10-CM

## 2020-04-01 DIAGNOSIS — Z9889 Other specified postprocedural states: Secondary | ICD-10-CM

## 2020-04-01 NOTE — Patient Instructions (Signed)
Change dressing in 1 week, apply some mupirocin ointment, the ABD pad, and gauze wrap and ACE wrap

## 2020-04-02 ENCOUNTER — Encounter: Payer: Medicare Other | Admitting: Podiatry

## 2020-04-04 ENCOUNTER — Encounter: Payer: Self-pay | Admitting: Podiatry

## 2020-04-04 NOTE — Progress Notes (Signed)
  Subjective:  Patient ID: Denise Conley, female    DOB: 06-Aug-1945,  MRN: [id number]  Chief Complaint  Patient presents with  . Routine Post Op    PT stated that she is doing okay she does have some pain but it is not horrible    DOS: 03/26/2020 Procedure: Removal of hardware and adjacent tissue transfer  75 y.o. female returns for post-op check.  Doing well, minimal pain  Review of Systems: Negative except as noted in the HPI. Denies N/V/F/Ch.   Objective:  There were no vitals filed for this visit. There is no height or weight on file to calculate BMI. Constitutional Well developed. Well nourished.  Vascular Foot warm and well perfused. Capillary refill normal to all digits.   Neurologic Normal speech. Oriented to person, place, and time. Epicritic sensation to light touch grossly present bilaterally.  Dermatologic Skin healing well without signs of infection. Skin edges well coapted without signs of infection.  Orthopedic: Tenderness to palpation noted about the surgical site.   Radiographs: Interval hardware removal of left ankle, ankle mortise is well aligned fracture is well-healed Assessment:   1. Orthopedic hardware present   2. 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 Closed fracture of left ankle with routine healing, subsequent encounter   4. Post-operative state    Plan:  Patient was evaluated and treated and all questions answered.  S/p foot surgery left -Progressing as expected post-operatively. -XR: Appropriate postoperative changes, minimal change in the ankle joint -WB Status: WBAT in CAM boot -Sutures: We will remain intact for 2 more weeks. -Medications: No refills required -Foot redressed.  She will change once in 1 week  Return in about 2 weeks (around 04/15/2020) for suture removal.

## 2020-04-06 ENCOUNTER — Encounter: Payer: Self-pay | Admitting: Podiatry

## 2020-04-08 ENCOUNTER — Encounter: Payer: Medicare Other | Admitting: Podiatry

## 2020-04-09 ENCOUNTER — Encounter: Payer: Medicare Other | Admitting: Podiatry

## 2020-04-13 ENCOUNTER — Ambulatory Visit (INDEPENDENT_AMBULATORY_CARE_PROVIDER_SITE_OTHER): Payer: Medicare Other | Admitting: Podiatry

## 2020-04-13 ENCOUNTER — Other Ambulatory Visit: Payer: Self-pay

## 2020-04-13 DIAGNOSIS — L97322 Non-pressure chronic ulcer of left ankle with fat layer exposed: Secondary | ICD-10-CM

## 2020-04-13 DIAGNOSIS — Z978 Presence of other specified devices: Secondary | ICD-10-CM

## 2020-04-13 DIAGNOSIS — Z9889 Other specified postprocedural states: Secondary | ICD-10-CM

## 2020-04-14 ENCOUNTER — Encounter: Payer: Self-pay | Admitting: Podiatry

## 2020-04-14 NOTE — Progress Notes (Signed)
  Subjective:  Patient ID: Rika Daughdrill, female    DOB: 1945/04/13,  MRN: [id number]  Chief Complaint  Patient presents with  . Routine Post Op    PT stated that she is doing well she has no major concerns at this time    DOS: 03/26/2020 Procedure: Removal of hardware and adjacent tissue transfer  75 y.o. female returns for post-op check.  Doing well, minimal pain  Review of Systems: Negative except as noted in the HPI. Denies N/V/F/Ch.   Objective:  There were no vitals filed for this visit. There is no height or weight on file to calculate BMI. Constitutional Well developed. Well nourished.  Vascular Foot warm and well perfused. Capillary refill normal to all digits.   Neurologic Normal speech. Oriented to person, place, and time. Epicritic sensation to light touch grossly present bilaterally.  Dermatologic Skin healing well without signs of infection. Skin edges well coapted without signs of infection.  Orthopedic: Tenderness to palpation noted about the surgical site.   Radiographs: Interval hardware removal of left ankle, ankle mortise is well aligned fracture is well-healed Assessment:   No diagnosis found. Plan:  Patient was evaluated and treated and all questions answered.  S/p foot surgery left -Progressing as expected post-operatively. -XR: Appropriate postoperative changes, minimal change in the ankle joint -WB Status: WBAT in CAM boot -Sutures: All the 2 sutures were able to be removed.  She may begin bathing.  No soaking or scrubbing.  We will remove the remaining sutures in 1 week -Medications: No refills required   Return in about 1 week (around 04/20/2020) for suture removal.

## 2020-04-20 ENCOUNTER — Ambulatory Visit (INDEPENDENT_AMBULATORY_CARE_PROVIDER_SITE_OTHER): Payer: Medicare Other | Admitting: Podiatry

## 2020-04-20 ENCOUNTER — Encounter: Payer: Self-pay | Admitting: Podiatry

## 2020-04-20 ENCOUNTER — Other Ambulatory Visit: Payer: Self-pay

## 2020-04-20 DIAGNOSIS — L97322 Non-pressure chronic ulcer of left ankle with fat layer exposed: Secondary | ICD-10-CM

## 2020-04-20 DIAGNOSIS — Z9889 Other specified postprocedural states: Secondary | ICD-10-CM

## 2020-04-20 NOTE — Progress Notes (Signed)
  Subjective:  Patient ID: Denise Conley, female    DOB: May 26, 1945,  MRN: [id number]  Chief Complaint  Patient presents with  . Routine Post Op    PT stated that she is doing great she has no concerns and denies any pain at this time    DOS: 03/26/2020 Procedure: Removal of hardware and adjacent tissue transfer  75 y.o. female returns for post-op check.  Doing well, minimal pain  Review of Systems: Negative except as noted in the HPI. Denies N/V/F/Ch.   Objective:  There were no vitals filed for this visit. There is no height or weight on file to calculate BMI. Constitutional Well developed. Well nourished.  Vascular Foot warm and well perfused. Capillary refill normal to all digits.   Neurologic Normal speech. Oriented to person, place, and time. Epicritic sensation to light touch grossly present bilaterally.  Dermatologic Incision is well healed  Orthopedic: Tenderness to palpation noted about the surgical site.   Radiographs: Interval hardware removal of left ankle, ankle mortise is well aligned fracture is well-healed Assessment:   No diagnosis found. Plan:  Patient was evaluated and treated and all questions answered.  S/p foot surgery left -Suture removed. Finally healed, apply neosporin to incision for 2 more weeks, leave open to air. Resume full activities  Return in 6 months for 1 year followup from fracture ORIF  No follow-ups on file.

## 2020-04-22 ENCOUNTER — Encounter: Payer: Medicare Other | Admitting: Podiatry

## 2020-11-01 ENCOUNTER — Other Ambulatory Visit: Payer: Self-pay

## 2020-11-01 ENCOUNTER — Ambulatory Visit (INDEPENDENT_AMBULATORY_CARE_PROVIDER_SITE_OTHER): Payer: Medicare Other | Admitting: Podiatry

## 2020-11-01 DIAGNOSIS — S82892D Other fracture of left lower leg, subsequent encounter for closed fracture with routine healing: Secondary | ICD-10-CM

## 2020-11-01 DIAGNOSIS — M19079 Primary osteoarthritis, unspecified ankle and foot: Secondary | ICD-10-CM

## 2020-11-01 DIAGNOSIS — M19071 Primary osteoarthritis, right ankle and foot: Secondary | ICD-10-CM

## 2020-11-03 NOTE — Progress Notes (Signed)
  Subjective:  Patient ID: Denise Conley, female    DOB: 29-Apr-1945,  MRN: [id number]  Chief Complaint  Patient presents with   Foot Pain    Follow up right foot pain x 1 week flare up. Pt requested an injection for the arthritis pains.    Ankle Pain    6 month follow up for left ankle ulcer. Pt states she is doing well. Some aches but overall improvement.     75 y.o. female presents with the above complaint. History confirmed with patient.  She is doing well the ankle remains healed and has not had any recurrence of skin compromise.  The ankle does feel stiff sometimes but overall is doing much better.  The right foot is painful again and the injection was very helpful from the last visit.  She requests another injection. Objective:  Physical Exam: warm, good capillary refill, no trophic changes or ulcerative lesions, normal DP and PT pulses, and normal sensory exam.  Decent range of motion of the left ankle with minor edema present no pain with range of motion.  Incision remains well-healed with not hypertrophic scar.  Her right foot is painful over the third TMT joint where there is a palpable dorsal spur Assessment:   1. DJD (degenerative joint disease), ankle and foot, unspecified laterality   2. Closed fracture of left ankle with routine healing, subsequent encounter      Plan:  Patient was evaluated and treated and all questions answered.  Overall doing well continue full activity for her left ankle.  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  She has responded well to injections in the past.  Following sterile prep with Betadine I injected 10 mg of Kenalog and 2 mg of dexamethasone into the third TMT and she tolerated this well and was dressed with a Band-Aid.  Return if symptoms worsen or fail to improve.

## 2020-11-10 ENCOUNTER — Ambulatory Visit: Payer: Medicare Other | Admitting: Family Medicine

## 2021-03-15 DIAGNOSIS — H01015 Ulcerative blepharitis left lower eyelid: Secondary | ICD-10-CM | POA: Diagnosis not present

## 2021-03-15 DIAGNOSIS — H2512 Age-related nuclear cataract, left eye: Secondary | ICD-10-CM | POA: Diagnosis not present

## 2021-03-15 DIAGNOSIS — H25012 Cortical age-related cataract, left eye: Secondary | ICD-10-CM | POA: Diagnosis not present

## 2021-04-14 DIAGNOSIS — Z961 Presence of intraocular lens: Secondary | ICD-10-CM | POA: Diagnosis not present

## 2021-04-14 DIAGNOSIS — H2512 Age-related nuclear cataract, left eye: Secondary | ICD-10-CM | POA: Diagnosis not present

## 2021-04-14 DIAGNOSIS — H02831 Dermatochalasis of right upper eyelid: Secondary | ICD-10-CM | POA: Diagnosis not present

## 2021-04-14 DIAGNOSIS — H18413 Arcus senilis, bilateral: Secondary | ICD-10-CM | POA: Diagnosis not present

## 2021-04-14 DIAGNOSIS — H02834 Dermatochalasis of left upper eyelid: Secondary | ICD-10-CM | POA: Diagnosis not present

## 2021-04-27 DIAGNOSIS — H2512 Age-related nuclear cataract, left eye: Secondary | ICD-10-CM | POA: Diagnosis not present

## 2021-05-02 ENCOUNTER — Ambulatory Visit: Payer: Medicare HMO | Admitting: Podiatry

## 2021-05-02 ENCOUNTER — Other Ambulatory Visit: Payer: Self-pay

## 2021-05-02 DIAGNOSIS — M19071 Primary osteoarthritis, right ankle and foot: Secondary | ICD-10-CM

## 2021-05-02 DIAGNOSIS — M19079 Primary osteoarthritis, unspecified ankle and foot: Secondary | ICD-10-CM

## 2021-05-03 NOTE — Progress Notes (Signed)
°  Subjective:  Patient ID: Denise Conley, female    DOB: [birth year],  MRN: [id number]  Chief Complaint  Patient presents with   DJD     6 mth L ankle f/u, req injection, R foot pain    76 y.o. female presents with the above complaint. History confirmed with patient.  Left ankle is doing well.  Right foot continues to be painful again and last injection was helpful.     Objective:  Physical Exam: warm, good capillary refill, no trophic changes or ulcerative lesions, normal DP and PT pulses, and normal sensory exam.  Decent range of motion of the left ankle with minor edema present no pain with range of motion.  Incision remains well-healed with not hypertrophic scar.  Her right foot is painful over the third TMT joint where there is a palpable dorsal spur Assessment:   1. DJD (degenerative joint disease), ankle and foot, unspecified laterality      Plan:  Patient was evaluated and treated and all questions answered.   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  She has responded well to injections at the last visit.  I recommended repeat injection today.  Caution we may not want to do more than 2-3 of these a year she is having some skin changes around the injection site.  Following sterile prep with Betadine I injected 10 mg of Kenalog and 2 mg of dexamethasone into the third TMT and she tolerated this well and was dressed with a Band-Aid.  Would like to try to space them out every 6 to 9 months  We will discuss custom-molded orthoses and she has some of these from her previous podiatrist in [place].  I would like her to meet with our pedorthist at some point for an exam to see if they are still functional as a semirigid orthosis may give her good support for the midfoot to reduce her pain from her midfoot arthritis.  Her biggest issue with him is that they do not fit correctly and many of her shoes I discussed we could fashion a slimmer.  That is still supportive and would fit in  more of her dress shoes or flats and still give her pain relief.  She is interested in this and will call when she is ready to schedule.  Return if symptoms worsen or fail to improve.

## 2021-05-04 ENCOUNTER — Ambulatory Visit: Payer: Medicare HMO

## 2021-05-04 ENCOUNTER — Other Ambulatory Visit: Payer: Self-pay

## 2021-05-04 DIAGNOSIS — M778 Other enthesopathies, not elsewhere classified: Secondary | ICD-10-CM

## 2021-05-04 NOTE — Progress Notes (Signed)
SITUATION ?Reason for Consult: Evaluation for Bilateral Custom Foot Orthoses ?Patient / Caregiver Report: Patient is ready for dress length foot orthotics ? ?OBJECTIVE DATA: ?Patient History / Diagnosis:  ?  ICD-10-CM   ?1. Capsulitis of right foot  M77.8   ?  ? ? ?Current or Previous Devices: Existing full foot custom orthotics [employer] [id number] from a [place] podiatrist ? ?Foot Examination: ?Skin presentation:   Intact ?Ulcers & Callousing:   None and no history ?Toe / Foot Deformities:  Pes cavus ?Weight Bearing Presentation:  Cavus ?Sensation:    Intact ? ?ORTHOTIC RECOMMENDATION ?Recommended Device: 1x pair of custom functional foot orthotics - dress length ? ?GOALS OF ORTHOSES ?- Reduce Pain ?- Prevent Foot Deformity ?- Prevent Progression of Further Foot Deformity ?- Relieve Pressure ?- Improve the Overall Biomechanical Function of the Foot and Lower Extremity. ? ?ACTIONS PERFORMED ?Patient was casted for Foot Orthoses via crush box. Procedure was explained and patient tolerated procedure well. All questions were answered and concerns addressed. ? ?PLAN ?Potential out of pocket cost was communicated to patient. Casts are to be sent to [facility] for fabrication. Patient is to be called for fitting when devices are ready.  ? ? ?

## 2021-05-05 ENCOUNTER — Telehealth: Payer: Self-pay

## 2021-05-05 NOTE — Telephone Encounter (Signed)
Casts sent to central fabrication °

## 2021-05-24 DIAGNOSIS — E039 Hypothyroidism, unspecified: Secondary | ICD-10-CM | POA: Diagnosis not present

## 2021-06-09 ENCOUNTER — Ambulatory Visit (INDEPENDENT_AMBULATORY_CARE_PROVIDER_SITE_OTHER): Payer: Medicare HMO

## 2021-06-09 DIAGNOSIS — M778 Other enthesopathies, not elsewhere classified: Secondary | ICD-10-CM

## 2021-06-09 NOTE — Progress Notes (Signed)
SITUATION: ?Reason for Visit: Fitting and Delivery of Custom Fabricated Foot Orthoses ?Patient Report: Patient reports comfort and is satisfied with device. ? ?OBJECTIVE DATA: ?Patient History / Diagnosis:   ?  ICD-10-CM   ?1. Capsulitis of right foot  M77.8   ?  ? ? ?Provided Device:  Custom Functional Foot Orthotics ?    [employer]: [id number] ? ?GOAL OF ORTHOSIS ?- Improve gait ?- Decrease energy expenditure ?- Improve Balance ?- Provide Triplanar stability of foot complex ?- Facilitate motion ? ?ACTIONS PERFORMED ?Patient was fit with foot orthotics trimmed to shoe last. Patient tolerated fittign procedure.  ? ?Patient was provided with verbal and written instruction and demonstration regarding donning, doffing, wear, care, proper fit, function, purpose, cleaning, and use of the orthosis and in all related precautions and risks and benefits regarding the orthosis. ? ?Patient was also provided with verbal instruction regarding how to report any failures or malfunctions of the orthosis and necessary follow up care. Patient was also instructed to contact our office regarding any change in status that may affect the function of the orthosis. ? ?Patient demonstrated independence with proper donning, doffing, and fit and verbalized understanding of all instructions. ? ?PLAN: ?Patient is to follow up in one week or as necessary (PRN). All questions were answered and concerns addressed. Plan of care was discussed with and agreed upon by the patient. ? ?

## 2021-06-24 DIAGNOSIS — Z79899 Other long term (current) drug therapy: Secondary | ICD-10-CM | POA: Diagnosis not present

## 2021-06-24 DIAGNOSIS — I10 Essential (primary) hypertension: Secondary | ICD-10-CM | POA: Diagnosis not present

## 2021-06-24 DIAGNOSIS — E039 Hypothyroidism, unspecified: Secondary | ICD-10-CM | POA: Diagnosis not present

## 2021-06-24 DIAGNOSIS — Z1231 Encounter for screening mammogram for malignant neoplasm of breast: Secondary | ICD-10-CM | POA: Diagnosis not present

## 2021-06-30 DIAGNOSIS — N958 Other specified menopausal and perimenopausal disorders: Secondary | ICD-10-CM | POA: Diagnosis not present

## 2021-06-30 DIAGNOSIS — L439 Lichen planus, unspecified: Secondary | ICD-10-CM | POA: Diagnosis not present

## 2021-06-30 DIAGNOSIS — N3941 Urge incontinence: Secondary | ICD-10-CM | POA: Diagnosis not present

## 2021-06-30 DIAGNOSIS — N952 Postmenopausal atrophic vaginitis: Secondary | ICD-10-CM | POA: Diagnosis not present

## 2021-07-12 ENCOUNTER — Telehealth: Payer: Self-pay | Admitting: Podiatry

## 2021-07-12 NOTE — Telephone Encounter (Signed)
Pt called and is in [employer] and her right great toe joint is red and swollen and it is starting to go up foot. She is scheduled to see you next Tuesday. She is elevating and icing and taking ibuprofen is there anything else you would recommend for her to do? ?

## 2021-07-13 NOTE — Telephone Encounter (Signed)
Notified pt of what Dr Sherryle Lis said and she is to call tomorrow if she is wanting a prescription sent in.. She then stated it has been going on for about 2 wks. ?

## 2021-07-19 ENCOUNTER — Ambulatory Visit: Payer: Medicare HMO | Admitting: Podiatry

## 2021-07-19 DIAGNOSIS — M79671 Pain in right foot: Secondary | ICD-10-CM | POA: Diagnosis not present

## 2021-07-19 DIAGNOSIS — M10471 Other secondary gout, right ankle and foot: Secondary | ICD-10-CM

## 2021-07-19 MED ORDER — COLCHICINE 0.6 MG PO TABS: ORAL_TABLET | ORAL | 2 refills | Status: DC

## 2021-07-20 LAB — BASIC METABOLIC PANEL
BUN/Creatinine Ratio: 28 (ref 12–28)
BUN: 26 mg/dL (ref 8–27)
CO2: 25 mmol/L (ref 20–29)
Calcium: 9.3 mg/dL (ref 8.7–10.3)
Chloride: 106 mmol/L (ref 96–106)
Creatinine, Ser: 0.92 mg/dL (ref 0.57–1.00)
Glucose: 100 mg/dL — ABNORMAL HIGH (ref 70–99)
Potassium: 5.1 mmol/L (ref 3.5–5.2)
Sodium: 146 mmol/L — ABNORMAL HIGH (ref 134–144)
eGFR: 66 mL/min/{1.73_m2} (ref >59)

## 2021-07-20 LAB — URIC ACID: Uric Acid: 7.5 mg/dL (ref 3.1–7.9)

## 2021-07-24 NOTE — Progress Notes (Signed)
  Subjective:  Patient ID: Denise Conley, female    DOB: October 20, 1945,  MRN: [id number]  Chief Complaint  Patient presents with   Nail Problem      right great toe red and swollen and started swelling up foot    76 y.o. female presents with the above complaint. History confirmed with patient.  She returns for follow-up she has been taking ibuprofen and it has eased the pain some but still very painful for her.  Is been going on for about 2 weeks.  She has had gout flares before.  Objective:  Physical Exam: warm, good capillary refill, no trophic changes or ulcerative lesions, normal DP and PT pulses, normal sensory exam, and pain erythema and edema of the right first MTPJ Assessment:   1. Other secondary acute gout of right foot      Plan:  Patient was evaluated and treated and all questions answered.  We again discussed etiology treatment options of acute on chronic gout.  I ordered lab work including uric acid and BMP.  Prescribed colchicine and recommended corticosteroid injection.  Following verbal consent 1/2 cc of 0.5% Marcaine plain and 20 mg of Kenalog was injected in the first MTPJ on the right foot.  She tolerated this well.  Return if symptoms worsen or fail to improve.

## 2021-08-19 DIAGNOSIS — M10071 Idiopathic gout, right ankle and foot: Secondary | ICD-10-CM | POA: Diagnosis not present

## 2021-08-31 ENCOUNTER — Ambulatory Visit: Payer: Medicare HMO | Admitting: Podiatry

## 2021-10-11 ENCOUNTER — Encounter: Payer: Self-pay | Admitting: Podiatry

## 2021-10-11 DIAGNOSIS — M1A4790 Other secondary chronic gout, unspecified ankle and foot, without tophus (tophi): Secondary | ICD-10-CM

## 2021-10-14 NOTE — Telephone Encounter (Signed)
Faxed referral to [facility], confirmation has been received 10/14/21

## 2021-10-31 ENCOUNTER — Other Ambulatory Visit: Payer: Self-pay | Admitting: Podiatry

## 2021-11-11 ENCOUNTER — Ambulatory Visit: Payer: Self-pay | Admitting: Nurse Practitioner

## 2021-11-14 DIAGNOSIS — E039 Hypothyroidism, unspecified: Secondary | ICD-10-CM | POA: Diagnosis not present

## 2021-11-14 DIAGNOSIS — Z833 Family history of diabetes mellitus: Secondary | ICD-10-CM | POA: Diagnosis not present

## 2021-11-14 DIAGNOSIS — E559 Vitamin D deficiency, unspecified: Secondary | ICD-10-CM | POA: Diagnosis not present

## 2021-11-14 DIAGNOSIS — N3941 Urge incontinence: Secondary | ICD-10-CM | POA: Diagnosis not present

## 2021-11-14 DIAGNOSIS — Z880 Allergy status to penicillin: Secondary | ICD-10-CM | POA: Diagnosis not present

## 2021-11-14 DIAGNOSIS — Z008 Encounter for other general examination: Secondary | ICD-10-CM | POA: Diagnosis not present

## 2021-11-14 DIAGNOSIS — I10 Essential (primary) hypertension: Secondary | ICD-10-CM | POA: Diagnosis not present

## 2021-11-14 DIAGNOSIS — F419 Anxiety disorder, unspecified: Secondary | ICD-10-CM | POA: Diagnosis not present

## 2021-11-14 DIAGNOSIS — Z809 Family history of malignant neoplasm, unspecified: Secondary | ICD-10-CM | POA: Diagnosis not present

## 2021-11-14 DIAGNOSIS — M109 Gout, unspecified: Secondary | ICD-10-CM | POA: Diagnosis not present

## 2021-11-14 DIAGNOSIS — Z882 Allergy status to sulfonamides status: Secondary | ICD-10-CM | POA: Diagnosis not present

## 2021-11-14 DIAGNOSIS — Z8249 Family history of ischemic heart disease and other diseases of the circulatory system: Secondary | ICD-10-CM | POA: Diagnosis not present

## 2021-11-14 DIAGNOSIS — M1990 Unspecified osteoarthritis, unspecified site: Secondary | ICD-10-CM | POA: Diagnosis not present

## 2021-11-23 DIAGNOSIS — E663 Overweight: Secondary | ICD-10-CM | POA: Diagnosis not present

## 2021-11-23 DIAGNOSIS — M2540 Effusion, unspecified joint: Secondary | ICD-10-CM | POA: Diagnosis not present

## 2021-11-23 DIAGNOSIS — M2560 Stiffness of unspecified joint, not elsewhere classified: Secondary | ICD-10-CM | POA: Diagnosis not present

## 2021-11-23 DIAGNOSIS — M109 Gout, unspecified: Secondary | ICD-10-CM | POA: Diagnosis not present

## 2021-11-23 DIAGNOSIS — Z6827 Body mass index (BMI) 27.0-27.9, adult: Secondary | ICD-10-CM | POA: Diagnosis not present

## 2021-11-23 DIAGNOSIS — M79671 Pain in right foot: Secondary | ICD-10-CM | POA: Diagnosis not present

## 2021-12-21 DIAGNOSIS — M109 Gout, unspecified: Secondary | ICD-10-CM | POA: Diagnosis not present

## 2022-01-04 DIAGNOSIS — I10 Essential (primary) hypertension: Secondary | ICD-10-CM | POA: Diagnosis not present

## 2022-01-04 DIAGNOSIS — M109 Gout, unspecified: Secondary | ICD-10-CM | POA: Diagnosis not present

## 2022-01-04 DIAGNOSIS — E785 Hyperlipidemia, unspecified: Secondary | ICD-10-CM | POA: Diagnosis not present

## 2022-01-04 DIAGNOSIS — E559 Vitamin D deficiency, unspecified: Secondary | ICD-10-CM | POA: Diagnosis not present

## 2022-01-04 DIAGNOSIS — Z0000 Encounter for general adult medical examination without abnormal findings: Secondary | ICD-10-CM | POA: Diagnosis not present

## 2022-01-04 DIAGNOSIS — J309 Allergic rhinitis, unspecified: Secondary | ICD-10-CM | POA: Diagnosis not present

## 2022-01-04 DIAGNOSIS — E039 Hypothyroidism, unspecified: Secondary | ICD-10-CM | POA: Diagnosis not present

## 2022-01-04 DIAGNOSIS — Z23 Encounter for immunization: Secondary | ICD-10-CM | POA: Diagnosis not present

## 2022-01-04 DIAGNOSIS — I6529 Occlusion and stenosis of unspecified carotid artery: Secondary | ICD-10-CM | POA: Diagnosis not present

## 2022-01-09 IMAGING — US US EXTREM UP*L* LTD
1 series · 13 of 13 positions shown · non-contrast
Comparison: None.

CLINICAL DATA: Palpable abnormality of the dorsal left wrist for
the past 3 weeks.

EXAM:
ULTRASOUND LEFT UPPER EXTREMITY LIMITED
TECHNIQUE: Ultrasound examination of the upper extremity soft tissues was
performed in the area of clinical concern.

[Series 1: us extrem up*left* ltd · 0.04mm/px · 13 of 13 slices shown]
[im 1/13]
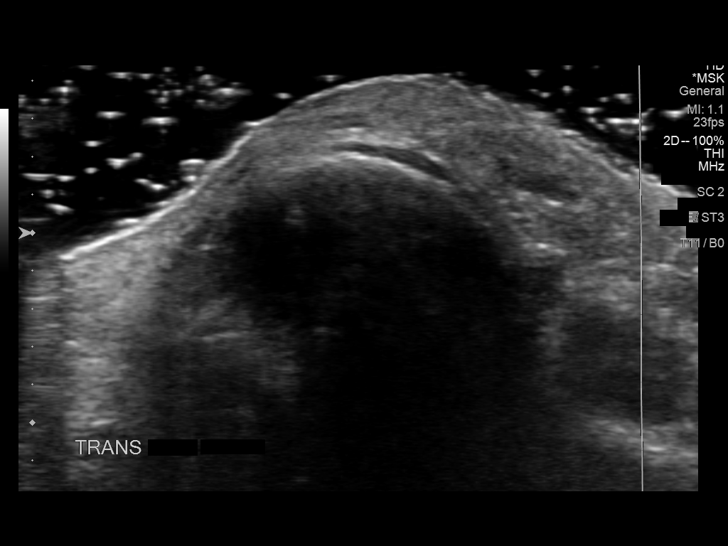
[im 2/13]
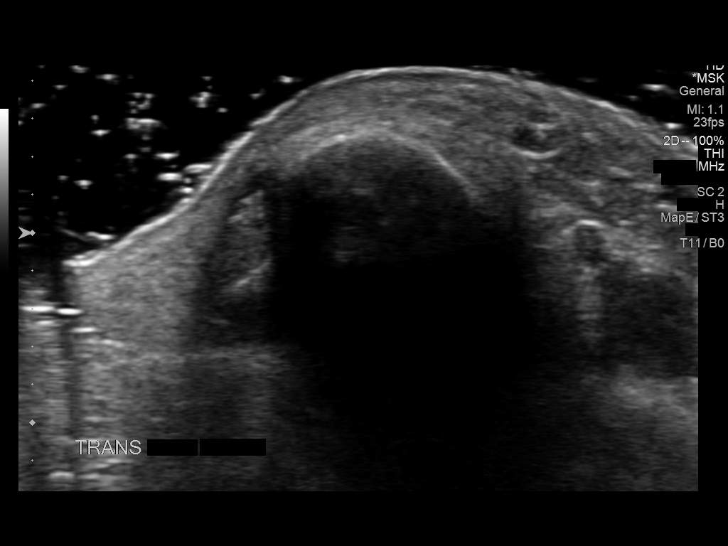
[im 3/13]
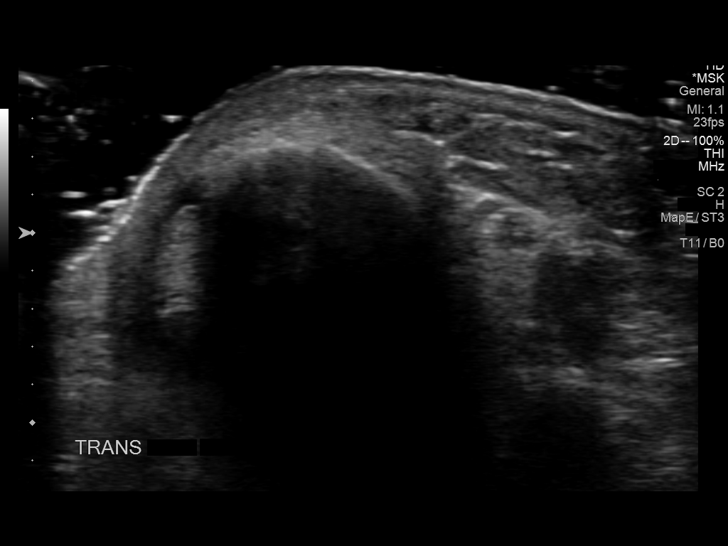
[im 4/13]
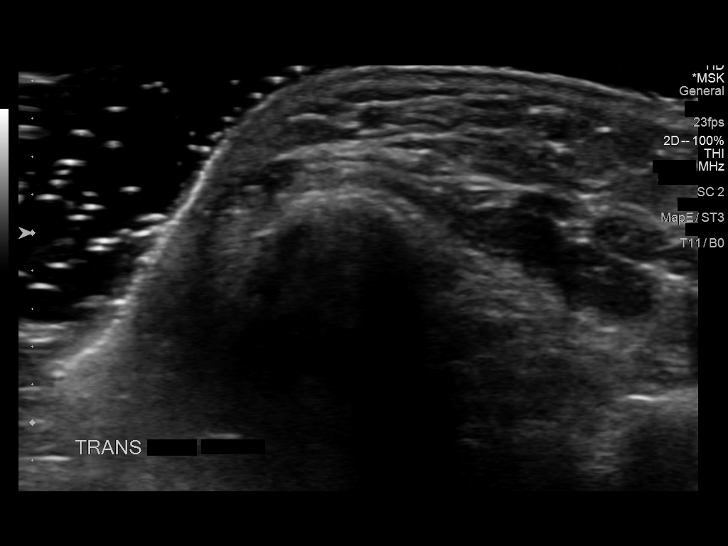
[im 5/13]
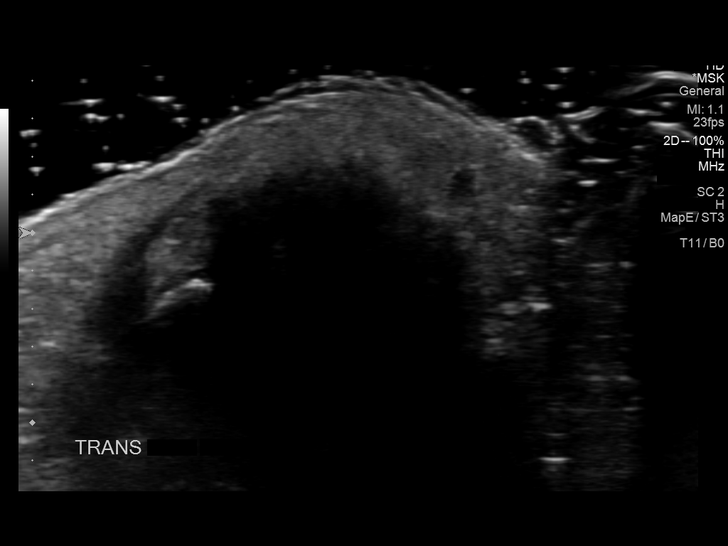
[im 6/13]
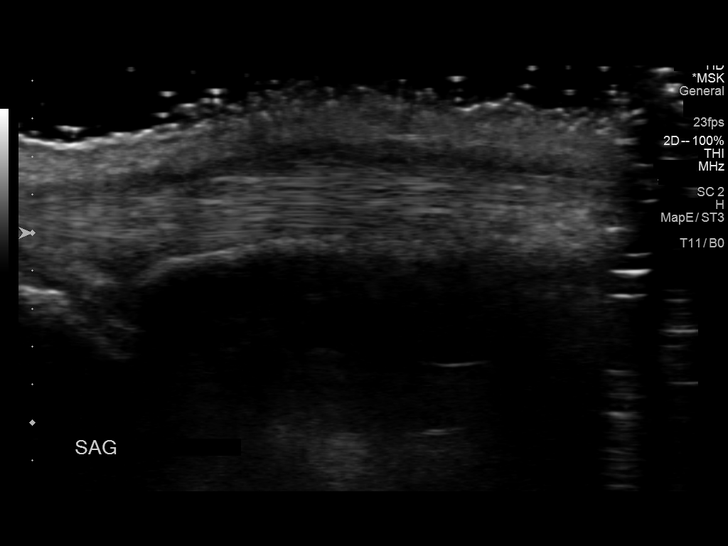
[im 7/13]
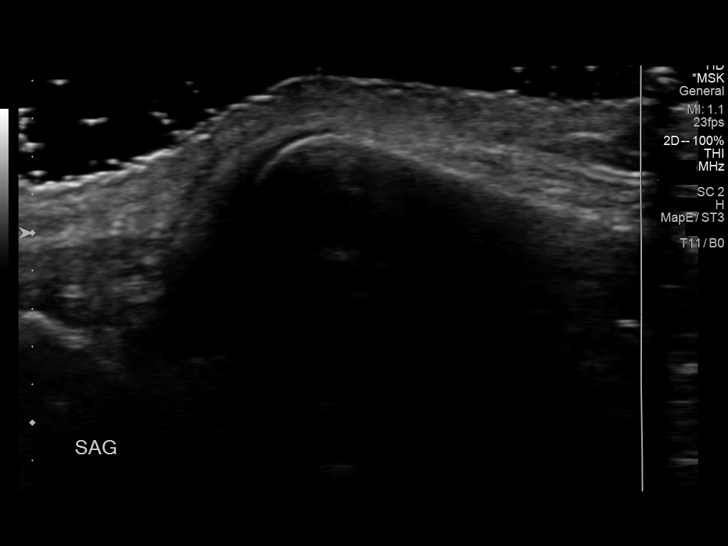
[im 8/13]
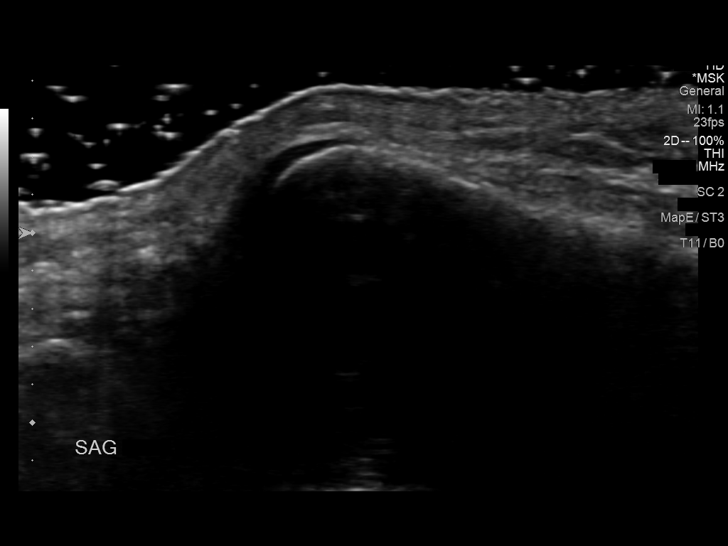
[im 9/13]
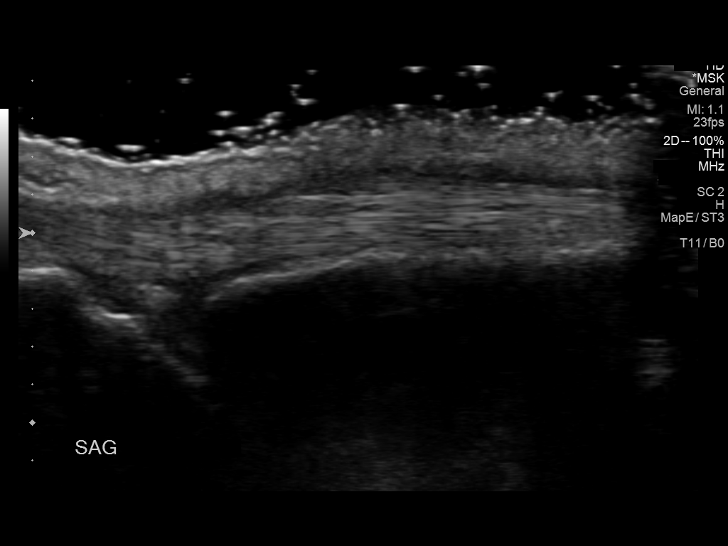
[im 10/13]
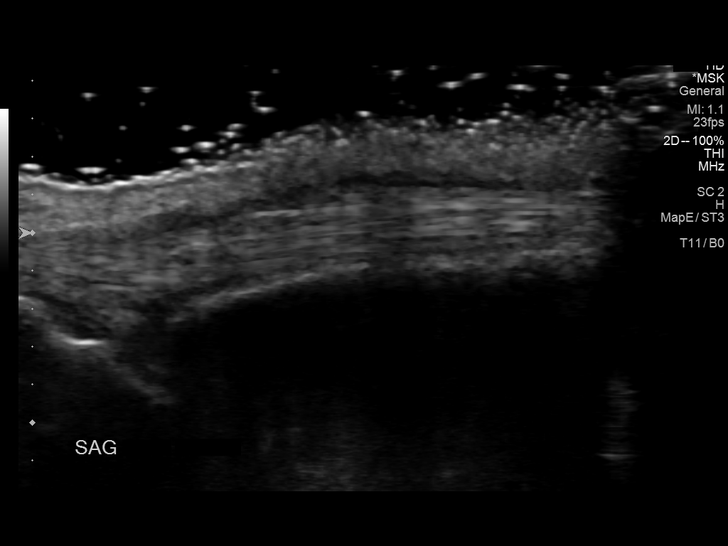
[im 11/13]
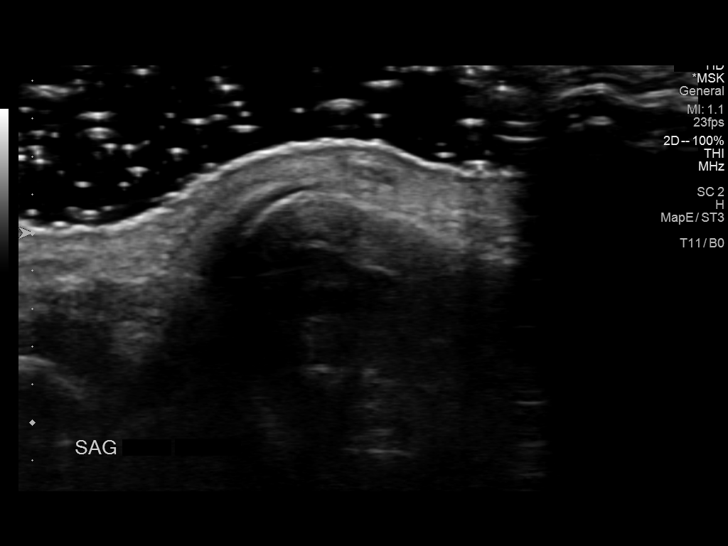
[im 12/13]
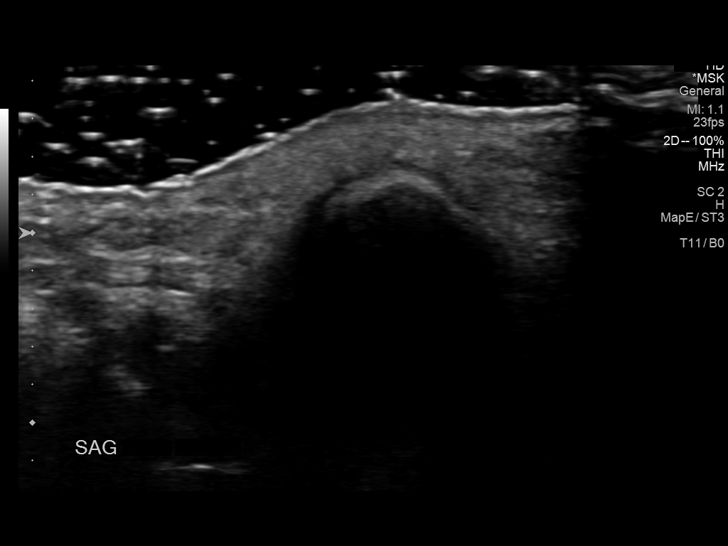
[im 13/13]
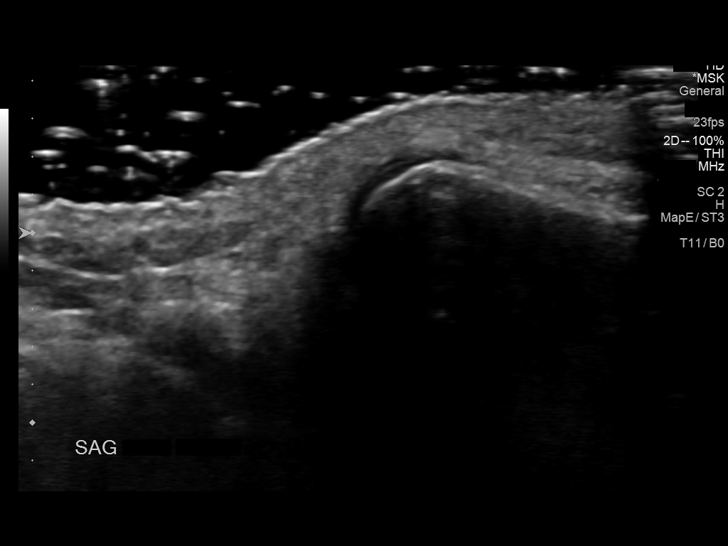

[13 of 13 positions shown; findings below may reference images not displayed]

FINDINGS: Focused ultrasound of the dorsal left wrist near the ulnar head
demonstrates no discrete soft tissue mass or fluid collection. The
visualized extensor carpi ulnaris tendon is unremarkable.
IMPRESSION: No sonographic correlate for the palpable abnormality near the ulnar
head.

## 2022-01-18 DIAGNOSIS — Z6827 Body mass index (BMI) 27.0-27.9, adult: Secondary | ICD-10-CM | POA: Diagnosis not present

## 2022-01-18 DIAGNOSIS — M109 Gout, unspecified: Secondary | ICD-10-CM | POA: Diagnosis not present

## 2022-01-18 DIAGNOSIS — M25519 Pain in unspecified shoulder: Secondary | ICD-10-CM | POA: Diagnosis not present

## 2022-01-18 DIAGNOSIS — M2560 Stiffness of unspecified joint, not elsewhere classified: Secondary | ICD-10-CM | POA: Diagnosis not present

## 2022-01-18 DIAGNOSIS — M2540 Effusion, unspecified joint: Secondary | ICD-10-CM | POA: Diagnosis not present

## 2022-01-18 DIAGNOSIS — M79671 Pain in right foot: Secondary | ICD-10-CM | POA: Diagnosis not present

## 2022-01-18 DIAGNOSIS — E663 Overweight: Secondary | ICD-10-CM | POA: Diagnosis not present

## 2022-01-26 IMAGING — DX DG TIBIA/FIBULA PORT 2V*L*
4 series · 4 of 4 positions shown · non-contrast
Comparison: February 19, 2019

CLINICAL DATA: Fall

EXAM:
PORTABLE LEFT TIBIA AND FIBULA - 2 VIEW

[tibia ap]
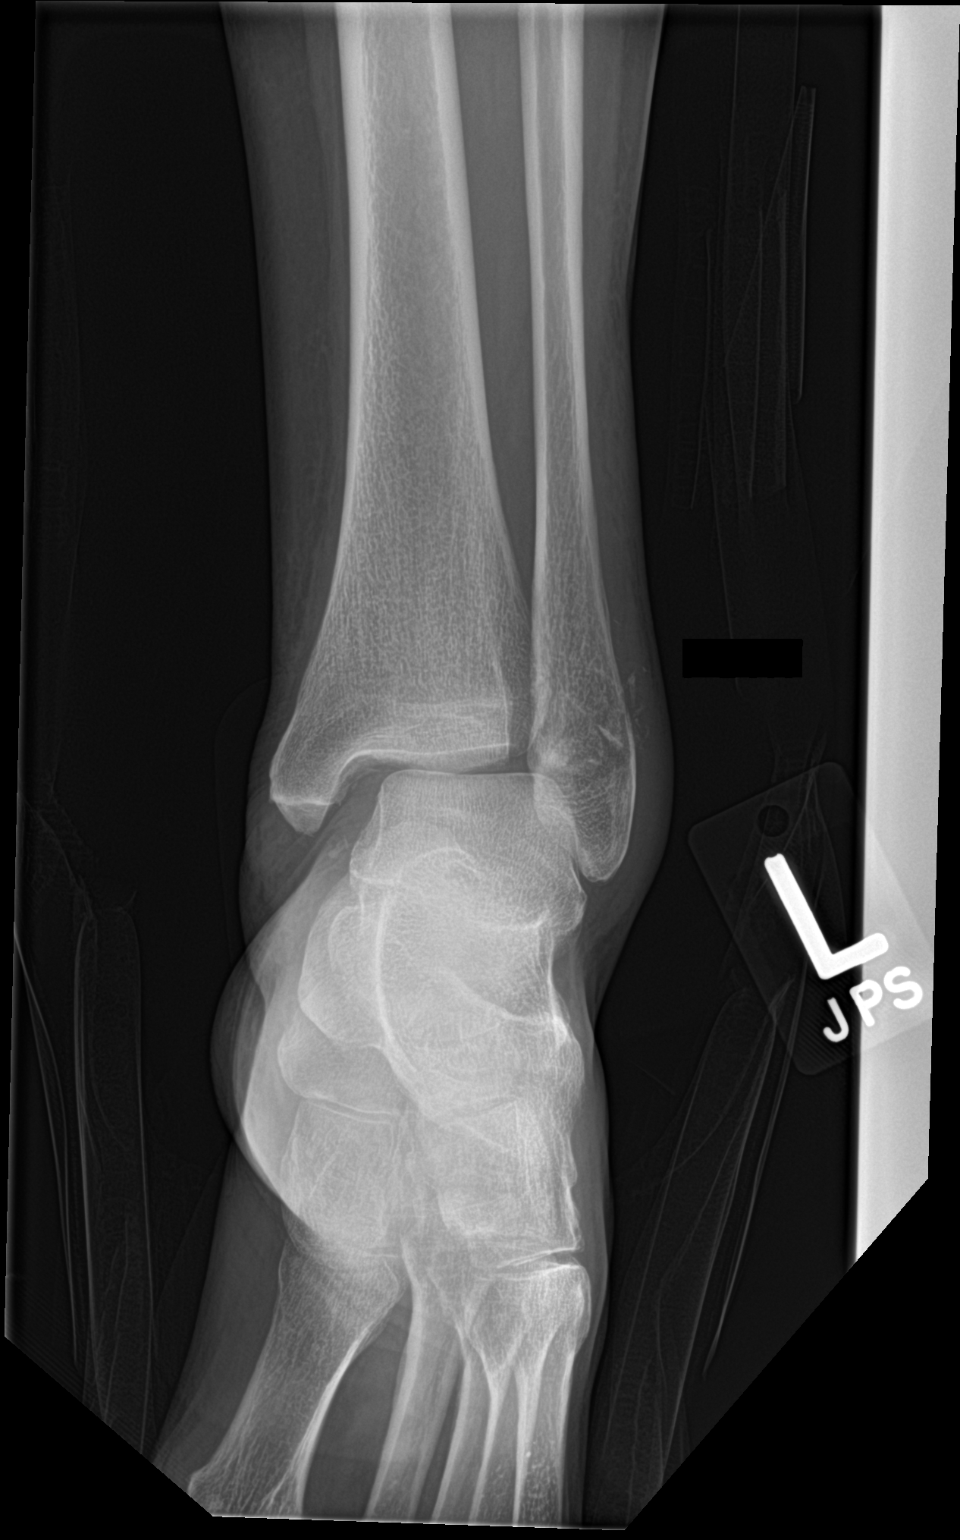

[tibia lat (1 of 3)]
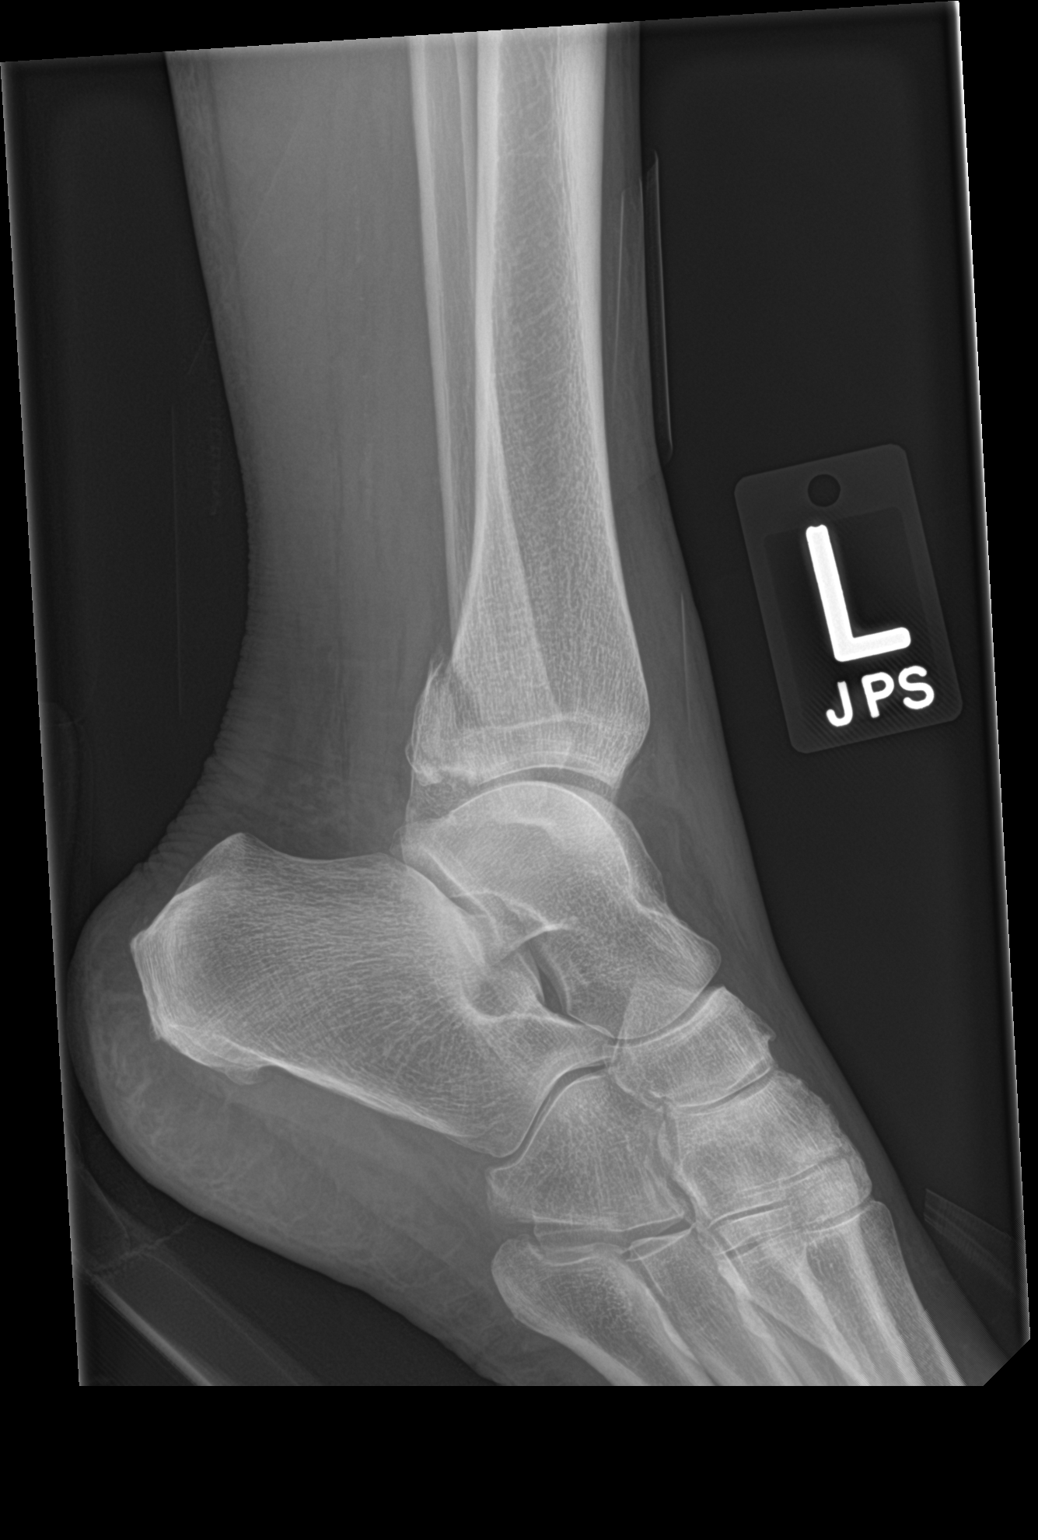

[tibia lat (2 of 3)]
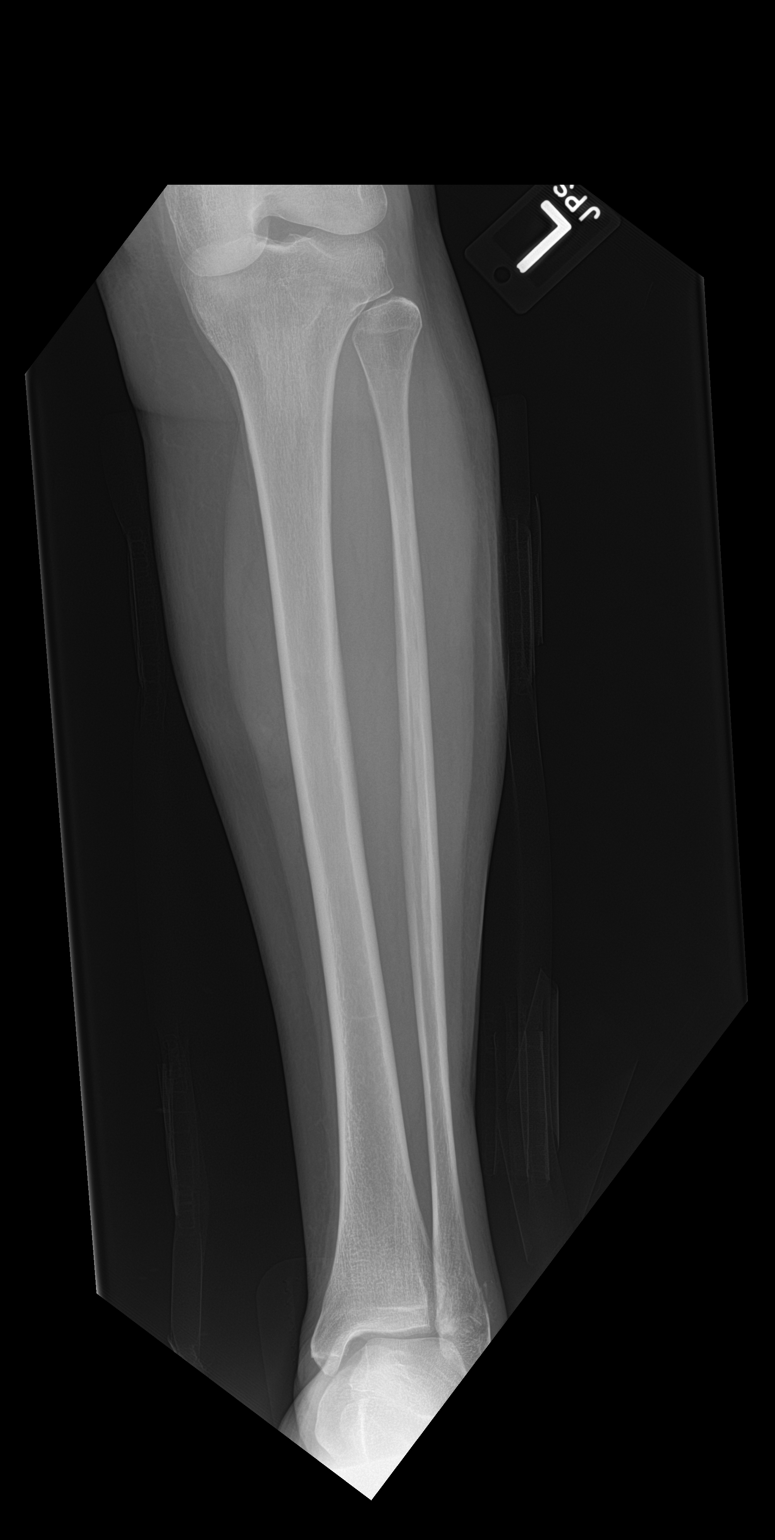

[tibia lat (3 of 3)]
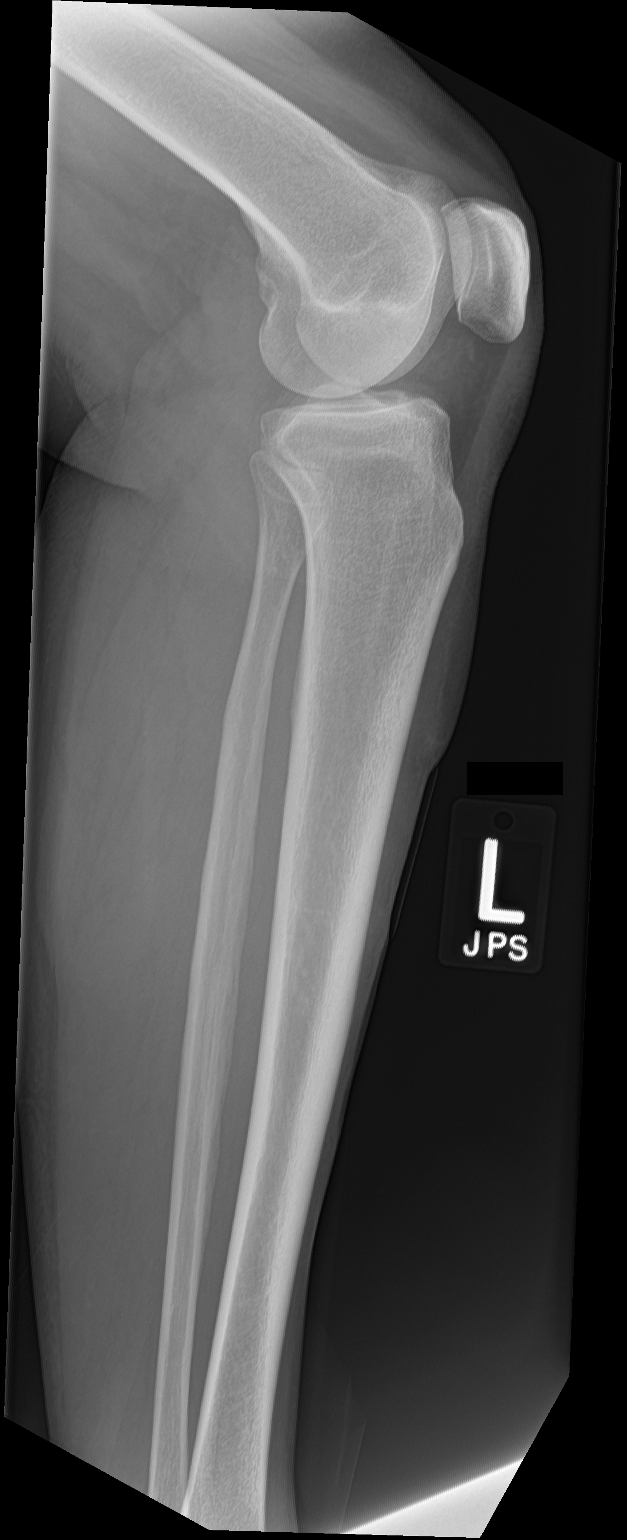

[4 of 4 positions shown; findings below may reference images not displayed]

FINDINGS: There is a minimally displaced fracture of the posterior malleolus.
There is a mildly displaced fracture of the distal fibula with [DATE]
shaft with displacement of the distal fragment. There is widening of
the medial malleolus to 8 mm on limited view. Soft tissue edema.
IMPRESSION: Fractures of the posterior malleolus and distal fibula. Widening of
the medial malleolus to 8 mm on limited view suggestive of
ligamentous injury. Recommend dedicated ankle radiographs for
additional evaluation.

## 2022-01-28 IMAGING — CT CT ANKLE*L* W/O CM
3 series · 13 of 33 positions shown, 16 images · non-contrast
Comparison: None.

CLINICAL DATA: Left ankle fracture status post fall x 2 days ago.
Pain, swelling, bruising

EXAM:
CT OF THE LEFT ANKLE WITHOUT CONTRAST
TECHNIQUE: Multidetector CT imaging of the left ankle was performed according
to the standard protocol. Multiplanar CT image reconstructions were
also generated.

[Series 5: sfov lower extremity 2.00 br40 s3 soft · axial · 0.33mm/px · z∈[+636,+762]mm · 5 of 91 slices shown, 7 images (1 of 3)]
[im 14/91  soft-tissue]
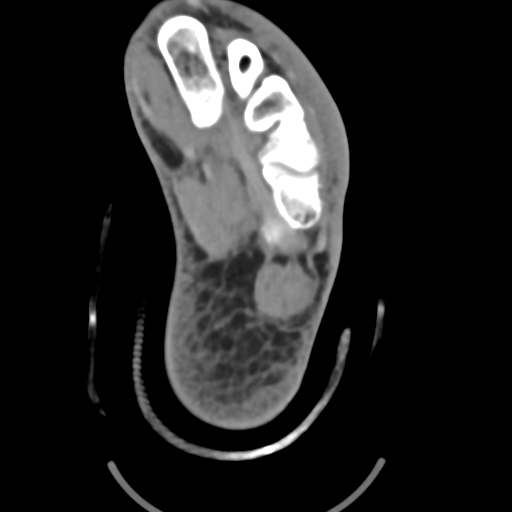
[im 14/91  bone]
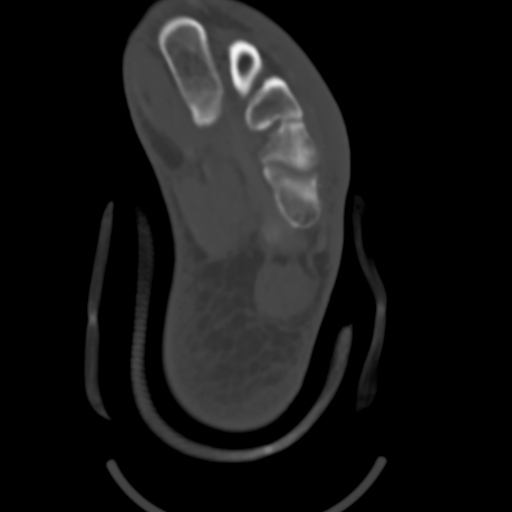
[im 28/91  bone]
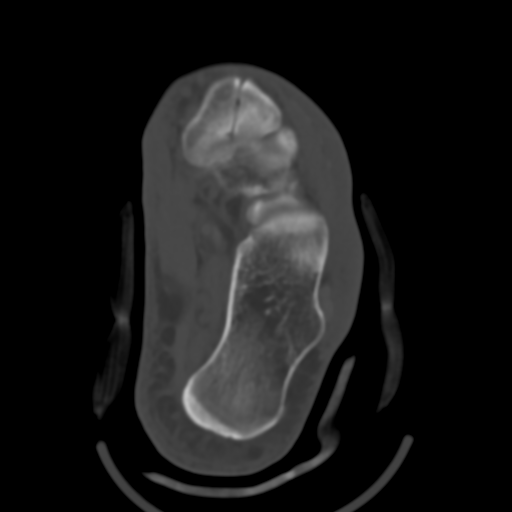
[im 49/91  bone]
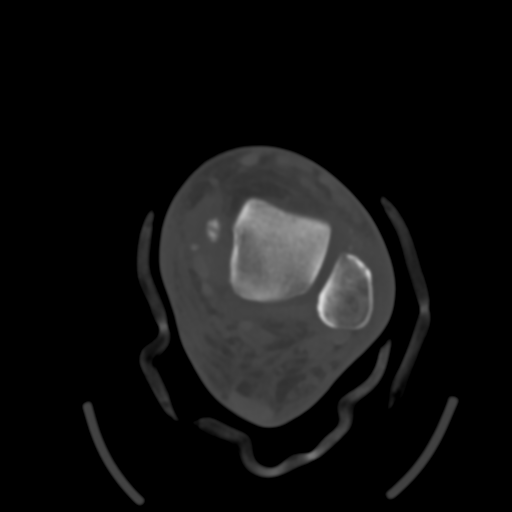
[im 63/91  bone]
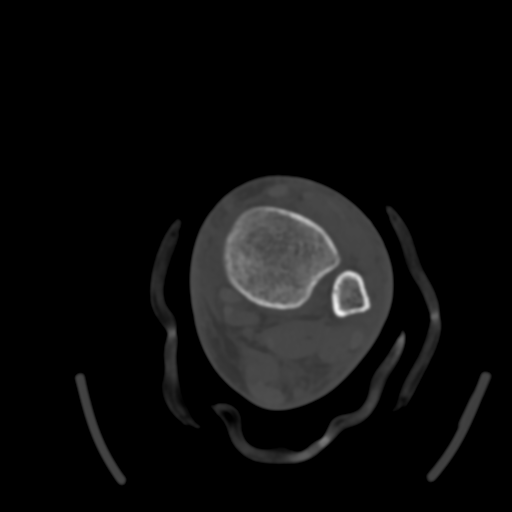
[im 77/91  soft-tissue]
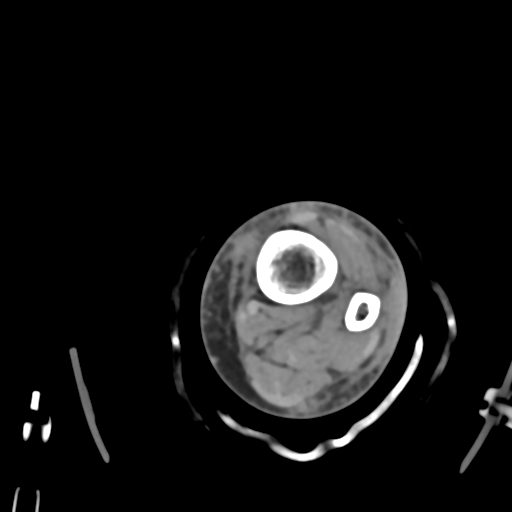
[im 77/91  bone]
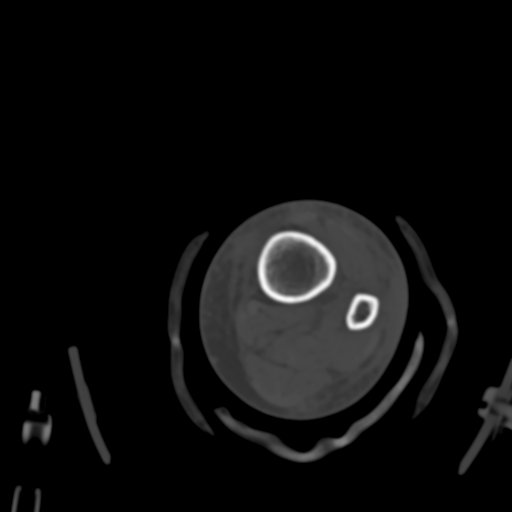

[Series 9: sfov lower extremity 2.00 br40 s3 soft · coronal · 0.24mm/px · 3 of 99 slices shown (2 of 3)]
[im 20/99  bone]
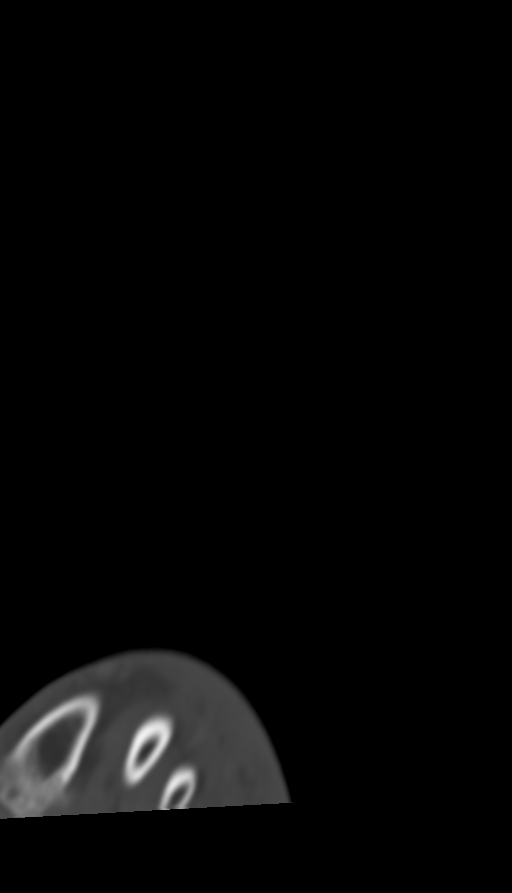
[im 40/99  bone]
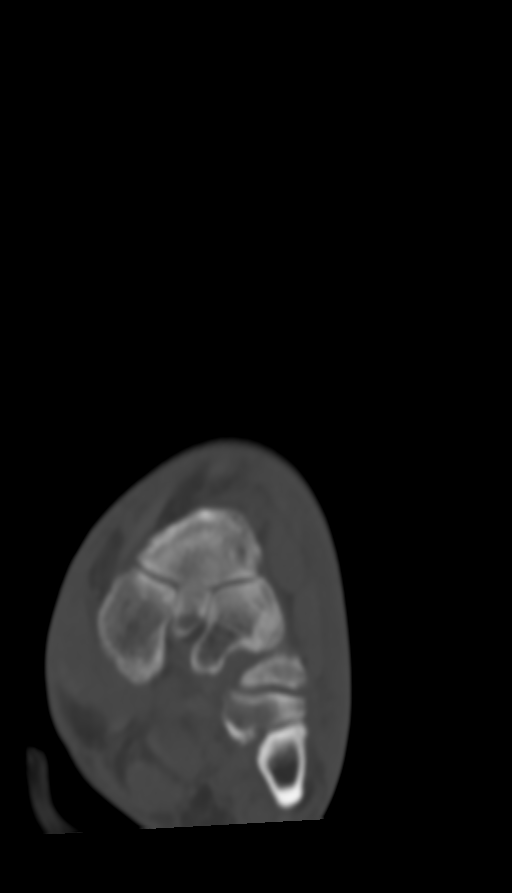
[im 59/99  bone]
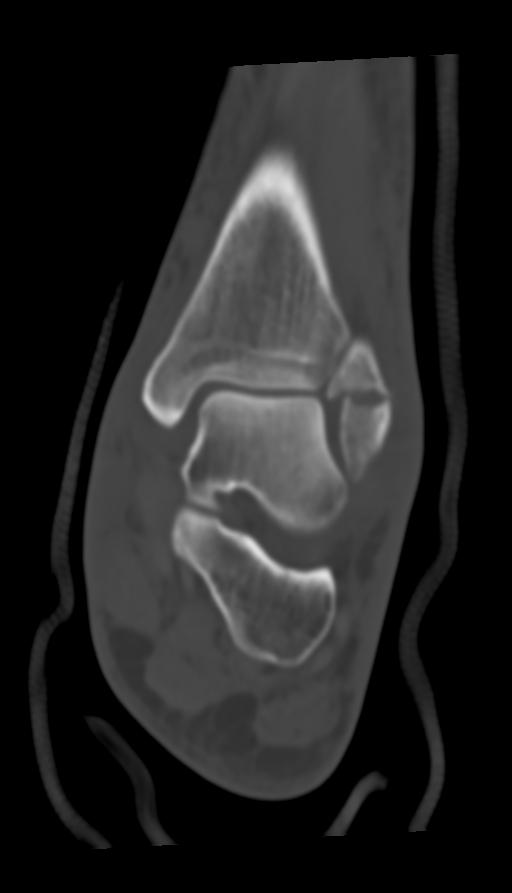

[Series 13: sfov lower extremity 2.00 br40 s3 soft · sagittal · 0.38mm/px · 5 of 61 slices shown, 6 images (3 of 3)]
[im 21/61  bone]
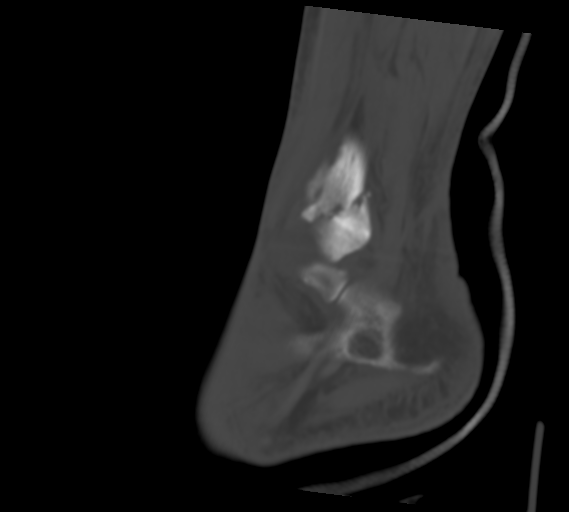
[im 26/61  bone]
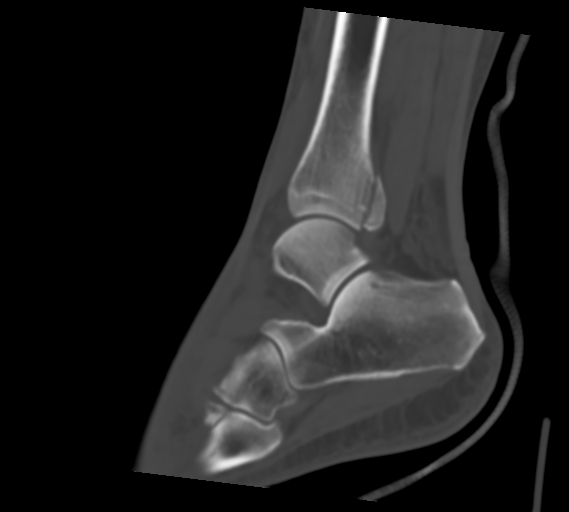
[im 31/61  soft-tissue]
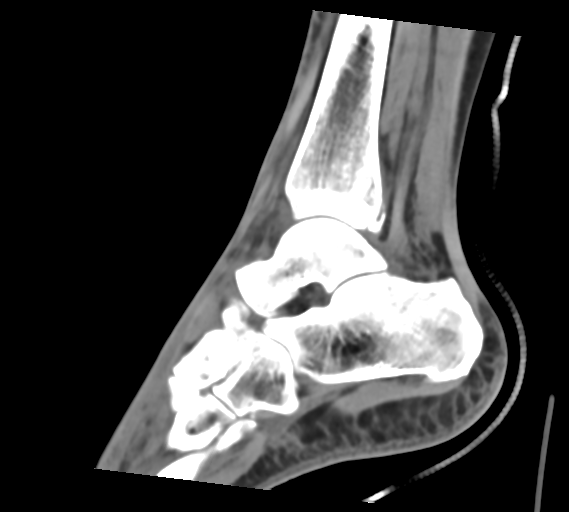
[im 31/61  bone]
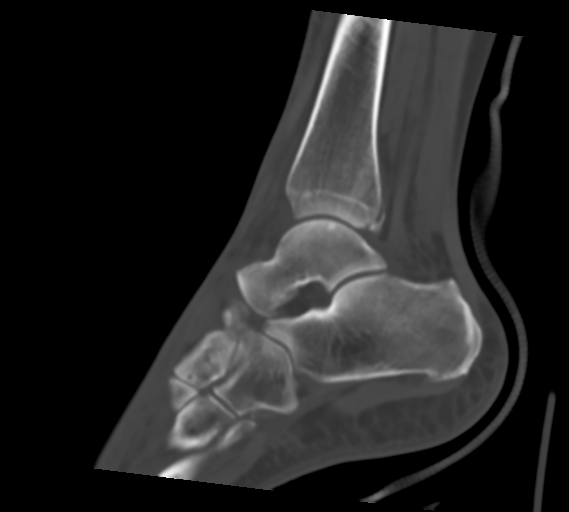
[im 36/61  bone]
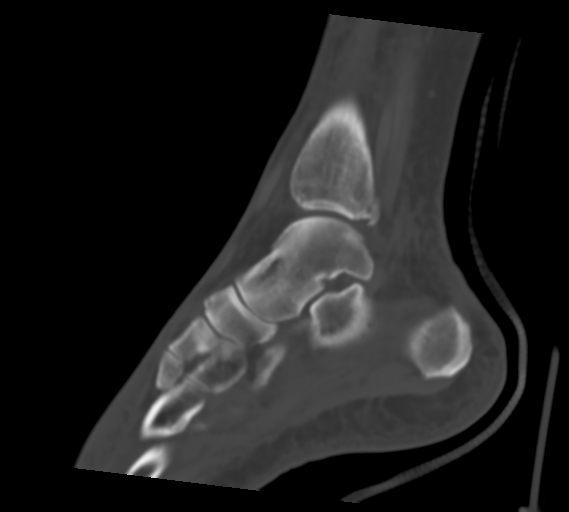
[im 41/61  bone]
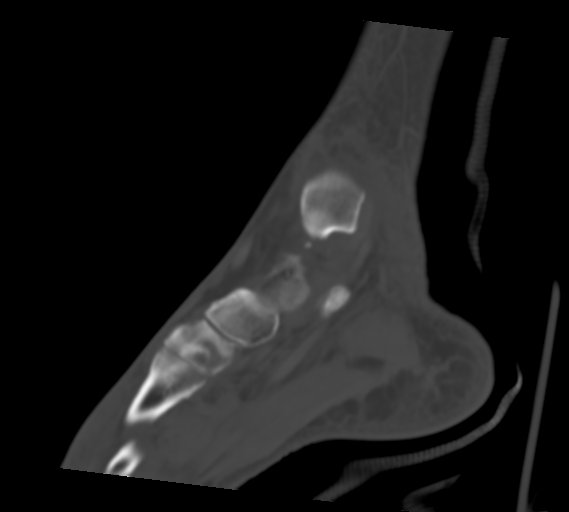

[13 of 33 positions shown; findings below may reference images not displayed]

FINDINGS: Bones/Joint/Cartilage

Comminuted oblique fracture of the distal fibular metaphysis with 2
mm posterior displacement.

Comminuted nondisplaced fracture of the posterior malleolus of the
distal tibia. 2 punctate calcification along the deltoid ligament
likely reflecting avulsive injury.

No other acute fracture or dislocation. Ankle mortise is intact. No
joint effusion.

Ligaments

Ligaments are suboptimally evaluated by CT.

Muscles and Tendons
Muscles are normal. Flexor, extensor, peroneal and Achilles tendons
are grossly intact. Posterior tibial tendon and flexor digitorum
tendon coursing medially adjacent to the posterior malleolar
fracture.

Soft tissue
No fluid collection or hematoma. No soft tissue mass. Soft tissue
edema circumferentially around the ankle.
IMPRESSION: 1. Comminuted oblique fracture of the distal fibular metaphysis with
2 mm posterior displacement.
2. Comminuted nondisplaced fracture of the posterior malleolus of
the distal tibia. Avulsive injury along the deltoid ligament.
3. Posterior tibial tendon and flexor digitorum tendon coursing
medially adjacent to the posterior malleolar fracture without
entrapment.

## 2022-04-19 DIAGNOSIS — M2560 Stiffness of unspecified joint, not elsewhere classified: Secondary | ICD-10-CM | POA: Diagnosis not present

## 2022-04-19 DIAGNOSIS — M2540 Effusion, unspecified joint: Secondary | ICD-10-CM | POA: Diagnosis not present

## 2022-04-19 DIAGNOSIS — Z6827 Body mass index (BMI) 27.0-27.9, adult: Secondary | ICD-10-CM | POA: Diagnosis not present

## 2022-04-19 DIAGNOSIS — M109 Gout, unspecified: Secondary | ICD-10-CM | POA: Diagnosis not present

## 2022-04-19 DIAGNOSIS — M79671 Pain in right foot: Secondary | ICD-10-CM | POA: Diagnosis not present

## 2022-04-19 DIAGNOSIS — E663 Overweight: Secondary | ICD-10-CM | POA: Diagnosis not present

## 2022-04-20 ENCOUNTER — Ambulatory Visit: Payer: Medicare HMO | Admitting: Podiatry

## 2022-04-20 DIAGNOSIS — M19071 Primary osteoarthritis, right ankle and foot: Secondary | ICD-10-CM

## 2022-04-20 DIAGNOSIS — H01015 Ulcerative blepharitis left lower eyelid: Secondary | ICD-10-CM | POA: Diagnosis not present

## 2022-04-20 NOTE — Progress Notes (Signed)
  Subjective:  Patient ID: Denise Conley, female    DOB: 05-24-1945,  MRN: [id number]  Chief Complaint  Patient presents with   Gout    Right foot injection    76 y.o. female presents with the above complaint. History confirmed with patient.  Left ankle is doing well.  Right foot continues to be painful again and last injection was helpful.     Objective:  Physical Exam: warm, good capillary refill, no trophic changes or ulcerative lesions, normal DP and PT pulses, and normal sensory exam.  Decent range of motion of the left ankle with minor edema present no pain with range of motion.  Incision remains well-healed with not hypertrophic scar.  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  She has responded well to injections at the previous visits.  Last 1 was almost a year ago.  Following sterile prep with Betadine and consent the midtarsal joint of the third TMT J was injected with 20 mg of Kenalog and 0.5 cc of 0.5% Marcaine plain.  Return as needed when this becomes painful again  Gout is under good control now on all, let me know if there are flares  Custom molded orthoses are helping continue wearing these  No follow-ups on file.

## 2022-05-08 DIAGNOSIS — Z0100 Encounter for examination of eyes and vision without abnormal findings: Secondary | ICD-10-CM | POA: Diagnosis not present

## 2022-06-21 DIAGNOSIS — Z1231 Encounter for screening mammogram for malignant neoplasm of breast: Secondary | ICD-10-CM | POA: Diagnosis not present

## 2022-06-29 DIAGNOSIS — F419 Anxiety disorder, unspecified: Secondary | ICD-10-CM | POA: Diagnosis not present

## 2022-06-29 DIAGNOSIS — F439 Reaction to severe stress, unspecified: Secondary | ICD-10-CM | POA: Diagnosis not present

## 2022-06-29 DIAGNOSIS — E039 Hypothyroidism, unspecified: Secondary | ICD-10-CM | POA: Diagnosis not present

## 2022-06-29 DIAGNOSIS — R7309 Other abnormal glucose: Secondary | ICD-10-CM | POA: Diagnosis not present

## 2022-06-29 DIAGNOSIS — E559 Vitamin D deficiency, unspecified: Secondary | ICD-10-CM | POA: Diagnosis not present

## 2022-06-29 DIAGNOSIS — M109 Gout, unspecified: Secondary | ICD-10-CM | POA: Diagnosis not present

## 2022-06-29 DIAGNOSIS — I10 Essential (primary) hypertension: Secondary | ICD-10-CM | POA: Diagnosis not present

## 2022-06-29 DIAGNOSIS — J302 Other seasonal allergic rhinitis: Secondary | ICD-10-CM | POA: Diagnosis not present

## 2022-07-20 DIAGNOSIS — N3941 Urge incontinence: Secondary | ICD-10-CM | POA: Diagnosis not present

## 2022-07-20 DIAGNOSIS — L439 Lichen planus, unspecified: Secondary | ICD-10-CM | POA: Diagnosis not present

## 2022-07-20 DIAGNOSIS — N952 Postmenopausal atrophic vaginitis: Secondary | ICD-10-CM | POA: Diagnosis not present

## 2022-07-26 DIAGNOSIS — M2560 Stiffness of unspecified joint, not elsewhere classified: Secondary | ICD-10-CM | POA: Diagnosis not present

## 2022-07-26 DIAGNOSIS — M79671 Pain in right foot: Secondary | ICD-10-CM | POA: Diagnosis not present

## 2022-07-26 DIAGNOSIS — Z6828 Body mass index (BMI) 28.0-28.9, adult: Secondary | ICD-10-CM | POA: Diagnosis not present

## 2022-07-26 DIAGNOSIS — M109 Gout, unspecified: Secondary | ICD-10-CM | POA: Diagnosis not present

## 2022-07-26 DIAGNOSIS — E663 Overweight: Secondary | ICD-10-CM | POA: Diagnosis not present

## 2022-07-26 DIAGNOSIS — M2540 Effusion, unspecified joint: Secondary | ICD-10-CM | POA: Diagnosis not present

## 2022-10-06 DIAGNOSIS — Z791 Long term (current) use of non-steroidal anti-inflammatories (NSAID): Secondary | ICD-10-CM | POA: Diagnosis not present

## 2022-10-06 DIAGNOSIS — N182 Chronic kidney disease, stage 2 (mild): Secondary | ICD-10-CM | POA: Diagnosis not present

## 2022-10-06 DIAGNOSIS — Z91013 Allergy to seafood: Secondary | ICD-10-CM | POA: Diagnosis not present

## 2022-10-06 DIAGNOSIS — M1990 Unspecified osteoarthritis, unspecified site: Secondary | ICD-10-CM | POA: Diagnosis not present

## 2022-10-06 DIAGNOSIS — F325 Major depressive disorder, single episode, in full remission: Secondary | ICD-10-CM | POA: Diagnosis not present

## 2022-10-06 DIAGNOSIS — E039 Hypothyroidism, unspecified: Secondary | ICD-10-CM | POA: Diagnosis not present

## 2022-10-06 DIAGNOSIS — N3941 Urge incontinence: Secondary | ICD-10-CM | POA: Diagnosis not present

## 2022-10-06 DIAGNOSIS — F419 Anxiety disorder, unspecified: Secondary | ICD-10-CM | POA: Diagnosis not present

## 2022-10-06 DIAGNOSIS — E559 Vitamin D deficiency, unspecified: Secondary | ICD-10-CM | POA: Diagnosis not present

## 2022-10-06 DIAGNOSIS — Z809 Family history of malignant neoplasm, unspecified: Secondary | ICD-10-CM | POA: Diagnosis not present

## 2022-10-06 DIAGNOSIS — Z8249 Family history of ischemic heart disease and other diseases of the circulatory system: Secondary | ICD-10-CM | POA: Diagnosis not present

## 2022-10-06 DIAGNOSIS — J302 Other seasonal allergic rhinitis: Secondary | ICD-10-CM | POA: Diagnosis not present

## 2022-12-27 DIAGNOSIS — Z6828 Body mass index (BMI) 28.0-28.9, adult: Secondary | ICD-10-CM | POA: Diagnosis not present

## 2022-12-27 DIAGNOSIS — M79671 Pain in right foot: Secondary | ICD-10-CM | POA: Diagnosis not present

## 2022-12-27 DIAGNOSIS — M2540 Effusion, unspecified joint: Secondary | ICD-10-CM | POA: Diagnosis not present

## 2022-12-27 DIAGNOSIS — E663 Overweight: Secondary | ICD-10-CM | POA: Diagnosis not present

## 2022-12-27 DIAGNOSIS — M2560 Stiffness of unspecified joint, not elsewhere classified: Secondary | ICD-10-CM | POA: Diagnosis not present

## 2022-12-27 DIAGNOSIS — M109 Gout, unspecified: Secondary | ICD-10-CM | POA: Diagnosis not present

## 2022-12-29 DIAGNOSIS — E039 Hypothyroidism, unspecified: Secondary | ICD-10-CM | POA: Diagnosis not present

## 2022-12-29 DIAGNOSIS — E7800 Pure hypercholesterolemia, unspecified: Secondary | ICD-10-CM | POA: Diagnosis not present

## 2022-12-29 DIAGNOSIS — F439 Reaction to severe stress, unspecified: Secondary | ICD-10-CM | POA: Diagnosis not present

## 2022-12-29 DIAGNOSIS — J302 Other seasonal allergic rhinitis: Secondary | ICD-10-CM | POA: Diagnosis not present

## 2022-12-29 DIAGNOSIS — E559 Vitamin D deficiency, unspecified: Secondary | ICD-10-CM | POA: Diagnosis not present

## 2022-12-29 DIAGNOSIS — Z23 Encounter for immunization: Secondary | ICD-10-CM | POA: Diagnosis not present

## 2022-12-29 DIAGNOSIS — R7303 Prediabetes: Secondary | ICD-10-CM | POA: Diagnosis not present

## 2022-12-29 DIAGNOSIS — M109 Gout, unspecified: Secondary | ICD-10-CM | POA: Diagnosis not present

## 2022-12-29 DIAGNOSIS — F419 Anxiety disorder, unspecified: Secondary | ICD-10-CM | POA: Diagnosis not present

## 2022-12-29 DIAGNOSIS — I10 Essential (primary) hypertension: Secondary | ICD-10-CM | POA: Diagnosis not present

## 2023-01-09 ENCOUNTER — Ambulatory Visit: Payer: Medicare HMO | Admitting: Podiatry

## 2023-01-09 DIAGNOSIS — M19071 Primary osteoarthritis, right ankle and foot: Secondary | ICD-10-CM

## 2023-01-09 NOTE — Progress Notes (Signed)
  Subjective:  Patient ID: Denise Conley, female    DOB: 07-15-1945,  MRN: [id number]  Chief Complaint  Patient presents with   Osteoarthritis    Right foot osteoarthritis. Pain is ongoing. Here today for another injection.     77 y.o. female presents with the above complaint. History confirmed with patient.  Right foot last injection helped until about mid October   Objective:  Physical Exam: warm, good capillary refill, no trophic changes or ulcerative lesions, normal DP and PT pulses, and normal sensory exam.   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  She has responded well to injections at the previous visits.  Last injection 9 months ago.  Following sterile prep with Betadine and consent the midtarsal joint of the third TMT J was injected with 20 mg of Kenalog and 0.5 cc of 0.5% Marcaine plain.  Return as needed when this becomes painful again    No follow-ups on file.

## 2023-01-25 DIAGNOSIS — R238 Other skin changes: Secondary | ICD-10-CM | POA: Diagnosis not present

## 2023-01-25 DIAGNOSIS — L820 Inflamed seborrheic keratosis: Secondary | ICD-10-CM | POA: Diagnosis not present

## 2023-03-01 ENCOUNTER — Ambulatory Visit: Payer: Medicare HMO | Admitting: Podiatry

## 2023-03-01 ENCOUNTER — Ambulatory Visit: Payer: Medicare HMO

## 2023-03-01 ENCOUNTER — Encounter: Payer: Self-pay | Admitting: Podiatry

## 2023-03-01 DIAGNOSIS — S92515A Nondisplaced fracture of proximal phalanx of left lesser toe(s), initial encounter for closed fracture: Secondary | ICD-10-CM

## 2023-03-01 DIAGNOSIS — M7752 Other enthesopathy of left foot: Secondary | ICD-10-CM | POA: Diagnosis not present

## 2023-03-01 NOTE — Progress Notes (Signed)
       Chief Complaint  Patient presents with   Arthritis    She reports her right foot is doing some better and there is still bruising on the 4th toe,     HPI: 77 y.o. female presents today for evaluation of left fourth toe injury.  States that she stubbed her toe 2 days ago.  She has been experiencing pain, swelling, bruising and has been unable to move the toe.  She has been seeing Dr. Lilian Kapur for osteoarthritis of right midfoot but has been doing well with this.  Denies any nausea, vomiting, fever, chills, chest pain or shortness of breath.  Past Medical History:  Diagnosis Date   Depression    Hypertension    Thyroid disease     History reviewed. No pertinent surgical history.  Allergies  Allergen Reactions   Shellfish Allergy    Sulfa Antibiotics     ROS negative except as stated in HPI   Physical Exam: There were no vitals filed for this visit.  General: The patient is alert and oriented x3 in no acute distress.  Dermatology: Skin is warm, dry and supple bilateral lower extremities. Interspaces are clear of maceration and debris.  Ecchymosis to left fourth toe.  Vascular: Palpable pedal pulses bilaterally. Capillary refill within normal limits.  No appreciable edema.  No erythema or calor.  Neurological: Light touch sensation grossly intact bilateral feet.   Musculoskeletal Exam: Pain on palpation of left fourth toe, painful with range of motion.  Localized edema present.  Radiographic Exam: Left foot 03/01/23 Nondisplaced fracture of base of proximal phalanx of 4th toe present, medial aspect involving roughly 1/3rd of the articular surface.  Assessment/Plan of Care: 1. Nondisplaced fracture of proximal phalanx of left lesser toe(s), initial encounter for closed fracture      No orders of the defined types were placed in this encounter.  None  Discussed clinical findings with patient today.  Plan: - Buddy splinted fourth toe to the adjacent third  toe - Surgical shoe dispensed.  Patient may weight-bear as tolerated in surgical shoe and continue buddy splinting. -May use RICE therapy as needed -Recommend over-the-counter Tylenol for pain control.  May take over-the-counter ibuprofen or Aleve for 1 week as well. -Patient states she is currently taking vitamin D supplementation -Follow-up in 4 weeks for follow-up x-rays   Orvan Papadakis L. Marchia Bond, AACFAS [facility]     [address] [place], [place] [address]                Office [phone]  Fax [phone]

## 2023-03-30 ENCOUNTER — Ambulatory Visit: Payer: Medicare HMO | Admitting: Podiatry

## 2023-03-30 ENCOUNTER — Ambulatory Visit (INDEPENDENT_AMBULATORY_CARE_PROVIDER_SITE_OTHER): Payer: Medicare HMO

## 2023-03-30 DIAGNOSIS — M778 Other enthesopathies, not elsewhere classified: Secondary | ICD-10-CM

## 2023-03-30 DIAGNOSIS — S92515D Nondisplaced fracture of proximal phalanx of left lesser toe(s), subsequent encounter for fracture with routine healing: Secondary | ICD-10-CM

## 2023-04-02 ENCOUNTER — Encounter: Payer: Self-pay | Admitting: Podiatry

## 2023-04-02 NOTE — Progress Notes (Signed)
       Chief Complaint  Patient presents with   Foot Injury    Does feel there is some improvement. The surgical shoe was way too big and would not hold her foot in place. Not having much pain. Still has ROM in toes. When she puts pressure on ball of foot she does have some pain. Wonders if maybe she had the buddy tape to tight.     HPI: 78 y.o. female presents today for follow-up evaluation of left fourth toe fracture approximately 1 month ago.  She states that pain has improved overall.  She states that the surgical shoe was too big for her and she returns in regular shoe gear.  Still has some soreness.  Does have range of motion of the toes.  Has been buddy splinting the toe, she was concerned that she may have been doing this too tight.  Denies any nausea, vomiting, fever, chills, chest pain or shortness of breath.  Past Medical History:  Diagnosis Date   Depression    Hypertension    Thyroid disease     History reviewed. No pertinent surgical history.  Allergies  Allergen Reactions   Shellfish Allergy    Sulfa Antibiotics     ROS negative except as stated in HPI   Physical Exam: There were no vitals filed for this visit.  General: The patient is alert and oriented x3 in no acute distress.  Dermatology: Skin is warm, dry and supple bilateral lower extremities. Interspaces are clear of maceration and debris.  Ecchymosis resolved to the fourth toe left foot  Vascular: Palpable pedal pulses bilaterally. Capillary refill within normal limits.  No appreciable edema.  No erythema or calor.  Neurological: Light touch sensation grossly intact bilateral feet.   Musculoskeletal Exam: Edema improved to the left fourth toe.  No tenderness on palpation of the fourth toe today.  Radiographic Exam: Left foot 03/30/2023 Nondisplaced fracture of base of proximal phalanx of 4th toe present, medial aspect involving roughly 1/3rd of the articular surface.  No change in fracture alignment.   Comparison studies with 03/01/2023  Assessment/Plan of Care: 1. Closed nondisplaced fracture of proximal phalanx of lesser toe of left foot with routine healing, subsequent encounter      No orders of the defined types were placed in this encounter.  None  Discussed clinical findings with patient today.  Plan: -Radiographs reviewed with the patient today. - May discontinue buddy splinting at this time - Advised that the patient continue with the surgical shoe to limit motion at the fracture site -May continue RICE therapy as needed - May take over-the-counter Tylenol for pain control -Patient states she is currently taking vitamin D supplementation -Follow-up in 4 weeks for follow-up x-rays   Stephen Baruch L. Marchia Bond, AACFAS [facility]     [address] [place], [place] [address]                Office [phone]  Fax [phone]

## 2023-04-13 DIAGNOSIS — J329 Chronic sinusitis, unspecified: Secondary | ICD-10-CM | POA: Diagnosis not present

## 2023-04-23 DIAGNOSIS — H43391 Other vitreous opacities, right eye: Secondary | ICD-10-CM | POA: Diagnosis not present

## 2023-04-23 DIAGNOSIS — Z961 Presence of intraocular lens: Secondary | ICD-10-CM | POA: Diagnosis not present

## 2023-04-23 DIAGNOSIS — H01015 Ulcerative blepharitis left lower eyelid: Secondary | ICD-10-CM | POA: Diagnosis not present

## 2023-04-27 ENCOUNTER — Encounter: Payer: Self-pay | Admitting: Podiatry

## 2023-04-27 ENCOUNTER — Ambulatory Visit (INDEPENDENT_AMBULATORY_CARE_PROVIDER_SITE_OTHER): Payer: Medicare HMO

## 2023-04-27 ENCOUNTER — Ambulatory Visit: Payer: Medicare HMO | Admitting: Podiatry

## 2023-04-27 DIAGNOSIS — S92515A Nondisplaced fracture of proximal phalanx of left lesser toe(s), initial encounter for closed fracture: Secondary | ICD-10-CM

## 2023-04-27 DIAGNOSIS — S92515D Nondisplaced fracture of proximal phalanx of left lesser toe(s), subsequent encounter for fracture with routine healing: Secondary | ICD-10-CM

## 2023-04-27 NOTE — Progress Notes (Signed)
       No chief complaint on file.   HPI: 78 y.o. female presents today for follow-up evaluation of left fourth toe fracture approximately 2 months ago.  She states that pain has improved overall.  She does return in regular shoe gear today.  Denies any pain.  Denies any nausea, vomiting, fever, chills, chest pain or shortness of breath.  Past Medical History:  Diagnosis Date   Depression    Hypertension    Thyroid disease     History reviewed. No pertinent surgical history.  Allergies  Allergen Reactions   Shellfish Allergy    Sulfa Antibiotics     ROS negative except as stated in HPI   Physical Exam: There were no vitals filed for this visit.  General: The patient is alert and oriented x3 in no acute distress.  Dermatology: Skin is warm, dry and supple bilateral lower extremities. Interspaces are clear of maceration and debris.  Ecchymosis resolved to the fourth toe left foot  Vascular: Palpable pedal pulses bilaterally. Capillary refill within normal limits.  No appreciable edema.  No erythema or calor.  Neurological: Light touch sensation grossly intact bilateral feet.   Musculoskeletal Exam: Edema improved to the left fourth toe.  No tenderness on palpation of the fourth toe today.  Radiographic Exam: Left foot 03/30/2023 Trabeculation across fracture line of the proximal phalanx of the fourth toe articular aspect with resorption of fracture line.  There is some fracture line appreciated proximal medial metaphysis, again with some trabeculation noted  Assessment/Plan of Care: 1. Closed nondisplaced fracture of proximal phalanx of lesser toe of left foot with routine healing, subsequent encounter      No orders of the defined types were placed in this encounter.  None  Discussed clinical findings with patient today.  Plan: -Radiographs reviewed with the patient today. - Denies any pain at this point -Okay to slowly progress into more activity and regular  shoe gear. -RICE therapy as needed -Try to limit any NSAID use over the next couple weeks.  Okay to take Tylenol 325 mg every 6 hours as needed for pain -Follow-up as needed if symptoms recur or worsen or if new pedal complaints arise.   Rayona Sardinha L. Marchia Bond, AACFAS [facility]     [address] [place], [place] [address]                Office [phone]  Fax [phone]

## 2023-08-16 DIAGNOSIS — Z1159 Encounter for screening for other viral diseases: Secondary | ICD-10-CM | POA: Diagnosis not present

## 2023-08-17 ENCOUNTER — Other Ambulatory Visit (HOSPITAL_BASED_OUTPATIENT_CLINIC_OR_DEPARTMENT_OTHER): Payer: Self-pay | Admitting: Family Medicine

## 2023-08-17 DIAGNOSIS — E2839 Other primary ovarian failure: Secondary | ICD-10-CM

## 2023-08-17 DIAGNOSIS — Z1231 Encounter for screening mammogram for malignant neoplasm of breast: Secondary | ICD-10-CM

## 2023-08-19 DIAGNOSIS — M545 Low back pain, unspecified: Secondary | ICD-10-CM | POA: Diagnosis not present

## 2023-08-19 DIAGNOSIS — I10 Essential (primary) hypertension: Secondary | ICD-10-CM | POA: Diagnosis not present

## 2023-09-06 ENCOUNTER — Emergency Department (HOSPITAL_BASED_OUTPATIENT_CLINIC_OR_DEPARTMENT_OTHER)

## 2023-09-06 ENCOUNTER — Other Ambulatory Visit: Payer: Self-pay

## 2023-09-06 ENCOUNTER — Emergency Department (HOSPITAL_BASED_OUTPATIENT_CLINIC_OR_DEPARTMENT_OTHER)
Admission: EM | Admit: 2023-09-06 | Discharge: 2023-09-06 | Disposition: A | Discharge status: Home / Self Care | Attending: Emergency Medicine | Admitting: Emergency Medicine

## 2023-09-06 ENCOUNTER — Encounter (HOSPITAL_BASED_OUTPATIENT_CLINIC_OR_DEPARTMENT_OTHER): Payer: Self-pay

## 2023-09-06 DIAGNOSIS — M542 Cervicalgia: Secondary | ICD-10-CM | POA: Diagnosis present

## 2023-09-06 DIAGNOSIS — I10 Essential (primary) hypertension: Secondary | ICD-10-CM | POA: Insufficient documentation

## 2023-09-06 DIAGNOSIS — Z79899 Other long term (current) drug therapy: Secondary | ICD-10-CM | POA: Diagnosis not present

## 2023-09-06 DIAGNOSIS — M436 Torticollis: Secondary | ICD-10-CM | POA: Insufficient documentation

## 2023-09-06 DIAGNOSIS — R519 Headache, unspecified: Secondary | ICD-10-CM | POA: Diagnosis not present

## 2023-09-06 MED ORDER — PREDNISONE 20 MG PO TABS: ORAL_TABLET | ORAL | 0 refills | Status: AC

## 2023-09-06 MED ORDER — LIDOCAINE 5 % EX PTCH
1.0000 | MEDICATED_PATCH | CUTANEOUS | Status: DC
  Administered 2023-09-06: 1 via TRANSDERMAL
  Filled 2023-09-06: qty 1

## 2023-09-06 MED ORDER — LIDOCAINE 5 % EX PTCH: 1.0000 | MEDICATED_PATCH | CUTANEOUS | 0 refills | Status: AC

## 2023-09-06 MED ORDER — KETOROLAC TROMETHAMINE 30 MG/ML IJ SOLN
30.0000 mg | Freq: Once | INTRAMUSCULAR | Status: AC
  Administered 2023-09-06: 30 mg via INTRAMUSCULAR
  Filled 2023-09-06: qty 1

## 2023-09-06 NOTE — ED Triage Notes (Signed)
 Pt report right side neck pain and back side head pain over the last couple days. Pt reports she can not turn head without her neck hurting. Pt denies dizziness or blurry vision. Pt speaking clearly and ambulatory with steady gait. Pt drove herself here. Pt reports taking motrin  at 10pm

## 2023-09-06 NOTE — ED Provider Notes (Signed)
 [place] EMERGENCY DEPARTMENT AT [facility] Provider Note   CSN: [id number] Arrival date & time: 09/06/23  0032     Patient presents with: Torticollis   Denise Conley is a 78 y.o. female.   The history is provided by the patient.  Illness Location:  Lateral neck right greater than left Quality:  Stiffness with movement Severity:  Moderate Onset quality:  Gradual Duration:  4 days Timing:  Constant Progression:  Unchanged Chronicity:  New Context:  No known injury, does not recall if she woke this way Relieved by:  Nothing Worsened by:  Movement Associated symptoms: no chest pain, no cough, no fever, no shortness of breath, no vomiting and no wheezing       Past Medical History:  Diagnosis Date   Depression    Hypertension    Thyroid  disease      Prior to Admission medications   Medication Sig Start Date End Date Taking? Authorizing Provider  lidocaine  (LIDODERM ) 5 % Place 1 patch onto the skin daily. Remove & Discard patch within 12 hours or as directed by MD 09/06/23  Yes Anshu Wehner, MD  predniSONE (DELTASONE) 20 MG tablet 3 tabs po day one, then 2 po daily x 4 days 09/06/23  Yes Yuliya Nova, MD  allopurinol (ZYLOPRIM) 100 MG tablet Take 100 mg by mouth daily.    [provider]  cholecalciferol (VITAMIN D3) 25 MCG (1000 UNIT) tablet TAKE 1 TABLET BY MOUTH EVERY DAY 02/16/20   McDonald, Juliene SAUNDERS, DPM  colchicine  0.6 MG tablet TAKE 1.2MG  (2 TABLETS) THEN 0.6MG  (1 TABLET) 1 HOUR AFTER. THEN, TAKE 1 TABLET EVERY DAY FOR 7 DAYS. 10/31/21   McDonald, Juliene SAUNDERS, DPM  fluocinonide cream (LIDEX) 0.05 % Apply 1 application topically 4 (four) times daily as needed (irritation vaginally).  12/24/18   [provider]  fluticasone (FLONASE) 50 MCG/ACT nasal spray Place 1-2 sprays into both nostrils daily as needed for allergies or rhinitis.    [provider]  gabapentin  (NEURONTIN ) 300 MG capsule Take 1 capsule (300 mg total) by mouth 3 (three)  times daily for 7 days. 10/17/19 10/24/19  Silva Juliene SAUNDERS, DPM  levothyroxine (SYNTHROID) 150 MCG tablet Take 150 mcg by mouth daily. Pt takes in addition to 225mcg every other day 05/04/15   [provider]  ramipril (ALTACE) 10 MG capsule Take 10 mg by mouth daily. 05/04/15   [provider]  sertraline (ZOLOFT) 100 MG tablet Take 100 mg by mouth daily. 05/04/15   [provider]    Allergies: Shellfish allergy and Sulfa antibiotics    Review of Systems  Constitutional:  Negative for fever.  Respiratory:  Negative for cough, shortness of breath and wheezing.   Cardiovascular:  Negative for chest pain.  Gastrointestinal:  Negative for vomiting.  All other systems reviewed and are negative.   Updated Vital Signs BP (!) 175/80   Pulse 60   Temp 98 F (36.7 C)   Resp 18   Ht 5' 6 (1.676 m)   Wt 79.4 kg   SpO2 97%   BMI 28.25 kg/m   Physical Exam Vitals and nursing note reviewed.  Constitutional:      General: She is not in acute distress.    Appearance: Normal appearance. She is well-developed.  HENT:     Head: Normocephalic and atraumatic.     Nose: Nose normal.  Eyes:     Pupils: Pupils are equal, round, and reactive to light.  Cardiovascular:  Rate and Rhythm: Normal rate and regular rhythm.     Pulses: Normal pulses.     Heart sounds: Normal heart sounds.  Pulmonary:     Effort: Pulmonary effort is normal. No respiratory distress.     Breath sounds: Normal breath sounds.  Abdominal:     General: Bowel sounds are normal. There is no distension.     Palpations: Abdomen is soft.     Tenderness: There is no abdominal tenderness. There is no guarding or rebound.  Musculoskeletal:        General: Normal range of motion.     Cervical back: Normal range of motion and neck supple.  Skin:    General: Skin is warm and dry.     Capillary Refill: Capillary refill takes less than 2 seconds.     Findings: No erythema or rash.  Neurological:      General: No focal deficit present.     Mental Status: She is alert and oriented to person, place, and time.     Deep Tendon Reflexes: Reflexes normal.  Psychiatric:        Mood and Affect: Mood normal.     (all labs ordered are listed, but only abnormal results are displayed) Labs Reviewed - No data to display  EKG: None  Radiology: CT Head Wo Contrast Result Date: 09/06/2023 CLINICAL DATA:  Facial trauma, blunt EXAM: CT HEAD WITHOUT CONTRAST CT CERVICAL SPINE WITHOUT CONTRAST TECHNIQUE: Multidetector CT imaging of the head and cervical spine was performed following the standard protocol without intravenous contrast. Multiplanar CT image reconstructions of the cervical spine were also generated. RADIATION DOSE REDUCTION: This exam was performed according to the departmental dose-optimization program which includes automated exposure control, adjustment of the mA and/or kV according to patient size and/or use of iterative reconstruction technique. COMPARISON:  None Available. FINDINGS: CT HEAD FINDINGS Brain: No evidence of acute infarction, hemorrhage, hydrocephalus, extra-axial collection or mass lesion/mass effect. Vascular: No hyperdense vessel. Skull: No acute fracture. Sinuses/Orbits: Mostly clear sinuses.  No acute orbital findings. Other: No mastoid effusions. CT CERVICAL SPINE FINDINGS Alignment: Anterolisthesis of C7 on T1, likely degenerative given facet arthropathy disc level. Otherwise, no substantial sagittal subluxation. Skull base and vertebrae: No acute fracture. No primary bone lesion or focal pathologic process. Soft tissues and spinal canal: No prevertebral fluid or swelling. No visible canal hematoma. Disc levels: Multilevel facet and uncovertebral hypertrophy with varying degrees of neural foraminal stenosis. Upper chest: Biapical pleuroparenchymal scarring. Otherwise, lung apices are clear. IMPRESSION: 1. No evidence of acute intracranial abnormality. 2. No evidence of acute  fracture or traumatic malalignment in the cervical. Electronically Signed   By: Gilmore GORMAN Molt M.D.   On: 09/06/2023 02:27   CT Cervical Spine Wo Contrast Result Date: 09/06/2023 CLINICAL DATA:  Facial trauma, blunt EXAM: CT HEAD WITHOUT CONTRAST CT CERVICAL SPINE WITHOUT CONTRAST TECHNIQUE: Multidetector CT imaging of the head and cervical spine was performed following the standard protocol without intravenous contrast. Multiplanar CT image reconstructions of the cervical spine were also generated. RADIATION DOSE REDUCTION: This exam was performed according to the departmental dose-optimization program which includes automated exposure control, adjustment of the mA and/or kV according to patient size and/or use of iterative reconstruction technique. COMPARISON:  None Available. FINDINGS: CT HEAD FINDINGS Brain: No evidence of acute infarction, hemorrhage, hydrocephalus, extra-axial collection or mass lesion/mass effect. Vascular: No hyperdense vessel. Skull: No acute fracture. Sinuses/Orbits: Mostly clear sinuses.  No acute orbital findings. Other: No mastoid effusions. CT CERVICAL SPINE  FINDINGS Alignment: Anterolisthesis of C7 on T1, likely degenerative given facet arthropathy disc level. Otherwise, no substantial sagittal subluxation. Skull base and vertebrae: No acute fracture. No primary bone lesion or focal pathologic process. Soft tissues and spinal canal: No prevertebral fluid or swelling. No visible canal hematoma. Disc levels: Multilevel facet and uncovertebral hypertrophy with varying degrees of neural foraminal stenosis. Upper chest: Biapical pleuroparenchymal scarring. Otherwise, lung apices are clear. IMPRESSION: 1. No evidence of acute intracranial abnormality. 2. No evidence of acute fracture or traumatic malalignment in the cervical. Electronically Signed   By: Gilmore GORMAN Molt M.D.   On: 09/06/2023 02:27     Procedures   Medications Ordered in the ED  lidocaine  (LIDODERM ) 5 % 1 patch  (1 patch Transdermal Patch Applied 09/06/23 0146)  ketorolac (TORADOL) 30 MG/ML injection 30 mg (30 mg Intramuscular Given 09/06/23 0146)                                    Medical Decision Making Patient with neck stiffness and pain laterally with side to side motion   Amount and/or Complexity of Data Reviewed External Data Reviewed: notes.    Details: Previous notes reviewed  Radiology: ordered and independent interpretation performed.    Details: Negative head CT  Risk Prescription drug management. Risk Details: Symptoms consistent with torticollis.  Will start steroids, heat and lidoderm .  Gentle stretching.  Stable for discharge with close follow up.  Strict returns.     Final diagnoses:  Torticollis   The patient is nontoxic-appearing on exam and vital signs are within normal limits.  I have reviewed the triage vital signs and the nursing notes. Pertinent labs & imaging results that were available during my care of the patient were reviewed by me and considered in my medical decision making (see chart for details). After history, exam, and medical workup I feel the patient has been appropriately medically screened and is safe for discharge home. Pertinent diagnoses were discussed with the patient. Patient was given return precautions. ED Discharge Orders          Ordered    lidocaine  (LIDODERM ) 5 %  Every 24 hours        09/06/23 0231    predniSONE (DELTASONE) 20 MG tablet        09/06/23 0231               Charish Schroepfer, MD 09/06/23 9661

## 2023-09-10 DIAGNOSIS — M542 Cervicalgia: Secondary | ICD-10-CM | POA: Diagnosis not present

## 2023-09-10 DIAGNOSIS — M4692 Unspecified inflammatory spondylopathy, cervical region: Secondary | ICD-10-CM | POA: Diagnosis not present

## 2023-09-12 DIAGNOSIS — M810 Age-related osteoporosis without current pathological fracture: Secondary | ICD-10-CM | POA: Diagnosis not present

## 2023-09-12 DIAGNOSIS — Z1231 Encounter for screening mammogram for malignant neoplasm of breast: Secondary | ICD-10-CM | POA: Diagnosis not present

## 2023-09-18 DIAGNOSIS — M542 Cervicalgia: Secondary | ICD-10-CM | POA: Diagnosis not present

## 2023-09-25 DIAGNOSIS — M542 Cervicalgia: Secondary | ICD-10-CM | POA: Diagnosis not present

## 2023-10-02 DIAGNOSIS — M542 Cervicalgia: Secondary | ICD-10-CM | POA: Diagnosis not present

## 2023-10-16 DIAGNOSIS — M542 Cervicalgia: Secondary | ICD-10-CM | POA: Diagnosis not present

## 2023-10-17 DIAGNOSIS — D369 Benign neoplasm, unspecified site: Secondary | ICD-10-CM | POA: Diagnosis not present

## 2023-10-17 DIAGNOSIS — D126 Benign neoplasm of colon, unspecified: Secondary | ICD-10-CM | POA: Diagnosis not present

## 2023-10-23 DIAGNOSIS — M542 Cervicalgia: Secondary | ICD-10-CM | POA: Diagnosis not present

## 2023-10-30 ENCOUNTER — Ambulatory Visit: Admitting: Podiatry

## 2023-10-30 ENCOUNTER — Ambulatory Visit (INDEPENDENT_AMBULATORY_CARE_PROVIDER_SITE_OTHER)

## 2023-10-30 ENCOUNTER — Encounter: Payer: Self-pay | Admitting: Podiatry

## 2023-10-30 VITALS — Ht 66.0 in | Wt 175.0 lb

## 2023-10-30 DIAGNOSIS — M19071 Primary osteoarthritis, right ankle and foot: Secondary | ICD-10-CM

## 2023-10-30 DIAGNOSIS — M19172 Post-traumatic osteoarthritis, left ankle and foot: Secondary | ICD-10-CM

## 2023-10-30 DIAGNOSIS — M19072 Primary osteoarthritis, left ankle and foot: Secondary | ICD-10-CM

## 2023-10-30 NOTE — Progress Notes (Signed)
  Subjective:  Patient ID: Denise Conley, female    DOB: 1945/11/09,  MRN: [id number]  Chief Complaint  Patient presents with   Injections    Rm 2 Patient is here for right foot pain and injection into right foot. Patient similar pain in left foot.    78 y.o. female presents with the above complaint. History confirmed with patient.  Right foot last injection helped until very recently.  Having some stiffness in the left ankle as well.  Some pain in the left midfoot as well.   Objective:  Physical Exam: warm, good capillary refill, no trophic changes or ulcerative lesions, normal DP and PT pulses, and normal sensory exam.   Her right foot is painful over the third TMT joint where there is a palpable dorsal spur.  Mild tenderness in the left midfoot.  No pain on the ankle but limited range of motion  New radiographs taken today of both feet show progressive osteoarthritis of the third tarsometatarsal joint of the right foot early osteoarthritis of the left third tarsometatarsal joint and early arthritis of the posterior ankle joint Assessment:   1. 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 Post-traumatic arthritis of ankle, left      Plan:  Patient was evaluated and treated and all questions answered.  We reviewed her x-rays and discussed she has had progressive changes with arthritic changes in the left ankle third tarsometatarsal joint of both feet as well.  So far the left foot midfoot is manageable for her and she will hold off on further injection at this point.  The ankle is also manageable mostly is stiff and I do not think that injection therapy would help with this.  She will let me know if this worsens.  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  She has responded well to injections at the previous visits.  Last injection 9 months ago.  Following sterile prep with Betadine and consent the midtarsal joint of  the right third TMT J was injected with 20 mg of Kenalog  and 0.5 cc of 0.5% Marcaine plain.  Return as needed when this becomes painful again    No follow-ups on file.

## 2023-11-08 DIAGNOSIS — D128 Benign neoplasm of rectum: Secondary | ICD-10-CM | POA: Diagnosis not present

## 2023-11-08 DIAGNOSIS — Z09 Encounter for follow-up examination after completed treatment for conditions other than malignant neoplasm: Secondary | ICD-10-CM | POA: Diagnosis not present

## 2023-11-08 DIAGNOSIS — D124 Benign neoplasm of descending colon: Secondary | ICD-10-CM | POA: Diagnosis not present

## 2023-11-08 DIAGNOSIS — D122 Benign neoplasm of ascending colon: Secondary | ICD-10-CM | POA: Diagnosis not present

## 2023-11-08 DIAGNOSIS — D120 Benign neoplasm of cecum: Secondary | ICD-10-CM | POA: Diagnosis not present

## 2023-11-08 DIAGNOSIS — Z860101 Personal history of adenomatous and serrated colon polyps: Secondary | ICD-10-CM | POA: Diagnosis not present

## 2023-11-12 DIAGNOSIS — D120 Benign neoplasm of cecum: Secondary | ICD-10-CM | POA: Diagnosis not present

## 2023-11-12 DIAGNOSIS — D128 Benign neoplasm of rectum: Secondary | ICD-10-CM | POA: Diagnosis not present

## 2023-11-12 DIAGNOSIS — D122 Benign neoplasm of ascending colon: Secondary | ICD-10-CM | POA: Diagnosis not present

## 2023-11-12 DIAGNOSIS — D124 Benign neoplasm of descending colon: Secondary | ICD-10-CM | POA: Diagnosis not present

## 2023-11-19 DIAGNOSIS — R062 Wheezing: Secondary | ICD-10-CM | POA: Diagnosis not present

## 2023-11-19 DIAGNOSIS — J219 Acute bronchiolitis, unspecified: Secondary | ICD-10-CM | POA: Diagnosis not present

## 2024-01-11 DIAGNOSIS — J0100 Acute maxillary sinusitis, unspecified: Secondary | ICD-10-CM | POA: Diagnosis not present

## 2024-02-08 DIAGNOSIS — F419 Anxiety disorder, unspecified: Secondary | ICD-10-CM | POA: Diagnosis not present

## 2024-02-08 DIAGNOSIS — I10 Essential (primary) hypertension: Secondary | ICD-10-CM | POA: Diagnosis not present

## 2024-02-08 DIAGNOSIS — R7303 Prediabetes: Secondary | ICD-10-CM | POA: Diagnosis not present

## 2024-02-08 DIAGNOSIS — E559 Vitamin D deficiency, unspecified: Secondary | ICD-10-CM | POA: Diagnosis not present

## 2024-02-08 DIAGNOSIS — J3489 Other specified disorders of nose and nasal sinuses: Secondary | ICD-10-CM | POA: Diagnosis not present

## 2024-02-08 DIAGNOSIS — E039 Hypothyroidism, unspecified: Secondary | ICD-10-CM | POA: Diagnosis not present

## 2024-02-08 DIAGNOSIS — F439 Reaction to severe stress, unspecified: Secondary | ICD-10-CM | POA: Diagnosis not present

## 2024-02-08 DIAGNOSIS — M109 Gout, unspecified: Secondary | ICD-10-CM | POA: Diagnosis not present

## 2024-02-08 DIAGNOSIS — E7800 Pure hypercholesterolemia, unspecified: Secondary | ICD-10-CM | POA: Diagnosis not present

## 2024-02-08 DIAGNOSIS — J302 Other seasonal allergic rhinitis: Secondary | ICD-10-CM | POA: Diagnosis not present

## 2024-02-19 DIAGNOSIS — J019 Acute sinusitis, unspecified: Secondary | ICD-10-CM | POA: Diagnosis not present
